# Patient Record
Sex: Female | Born: 1995 | Race: Black or African American | Hispanic: No | Marital: Single | Smoking: Never smoker
Health system: Southern US, Community
[De-identification: ages and names within clinical notes are randomized; demographics above are authoritative.]

## PROBLEM LIST (undated history)

## (undated) DIAGNOSIS — Z789 Other specified health status: Secondary | ICD-10-CM

## (undated) DIAGNOSIS — I209 Angina pectoris, unspecified: Secondary | ICD-10-CM

## (undated) HISTORY — PX: NO PAST SURGERIES: SHX2092

---
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
## 2017-07-16 ENCOUNTER — Other Ambulatory Visit: Payer: Self-pay

## 2017-07-16 ENCOUNTER — Encounter (HOSPITAL_COMMUNITY): Payer: Self-pay | Admitting: Emergency Medicine

## 2017-07-16 DIAGNOSIS — R0789 Other chest pain: Secondary | ICD-10-CM | POA: Insufficient documentation

## 2017-07-16 NOTE — ED Triage Notes (Signed)
Chest pain reported for several months  Pt has seen noone taken no meds

## 2017-07-17 ENCOUNTER — Emergency Department (HOSPITAL_COMMUNITY): Payer: Self-pay

## 2017-07-17 ENCOUNTER — Emergency Department (HOSPITAL_COMMUNITY)
Admission: EM | Admit: 2017-07-17 | Discharge: 2017-07-17 | Disposition: A | Discharge status: Home / Self Care | Payer: Self-pay | Attending: Emergency Medicine | Admitting: Emergency Medicine

## 2017-07-17 DIAGNOSIS — R0789 Other chest pain: Secondary | ICD-10-CM

## 2017-07-17 LAB — BASIC METABOLIC PANEL
Anion gap: 7 (ref 5–15)
BUN: 14 mg/dL (ref 6–20)
CO2: 27 mmol/L (ref 22–32)
Calcium: 9.1 mg/dL (ref 8.9–10.3)
Chloride: 107 mmol/L (ref 101–111)
Creatinine, Ser: 0.6 mg/dL (ref 0.44–1.00)
GFR calc Af Amer: >60 mL/min (ref >60)
GFR calc non Af Amer: >60 mL/min (ref >60)
Glucose, Bld: 100 mg/dL — ABNORMAL HIGH (ref 65–99)
Potassium: 3.5 mmol/L (ref 3.5–5.1)
Sodium: 141 mmol/L (ref 135–145)

## 2017-07-17 LAB — CBC WITH DIFFERENTIAL/PLATELET
Basophils Absolute: 0 10*3/uL (ref 0.0–0.1)
Basophils Relative: 0 %
Eosinophils Absolute: 0 10*3/uL (ref 0.0–0.7)
Eosinophils Relative: 1 %
HCT: 39.3 % (ref 36.0–46.0)
Hemoglobin: 12.6 g/dL (ref 12.0–15.0)
Lymphocytes Relative: 36 %
Lymphs Abs: 2.4 10*3/uL (ref 0.7–4.0)
MCH: 28.7 pg (ref 26.0–34.0)
MCHC: 32.1 g/dL (ref 30.0–36.0)
MCV: 89.5 fL (ref 78.0–100.0)
Monocytes Absolute: 0.5 10*3/uL (ref 0.1–1.0)
Monocytes Relative: 7 %
Neutro Abs: 3.8 10*3/uL (ref 1.7–7.7)
Neutrophils Relative %: 56 %
Platelets: 311 10*3/uL (ref 150–400)
RBC: 4.39 MIL/uL (ref 3.87–5.11)
RDW: 13.3 % (ref 11.5–15.5)
WBC: 6.7 10*3/uL (ref 4.0–10.5)

## 2017-07-17 LAB — TROPONIN I: Troponin I: <0.03 ng/mL (ref <0.03)

## 2017-07-17 NOTE — Discharge Instructions (Signed)
Use ice and heat for comfort. Take ibuprofen 600 mg + acetaminophen 650 mg every 6 hrs as needed for pain. Recheck by a primary care doctor if you continue to have symptoms. Return to the ED if you get pain constantly lasting more then 30 minutes or you get a fever, cough or struggle to breathe.

## 2017-07-17 NOTE — ED Provider Notes (Signed)
[facility] EMERGENCY DEPARTMENT Provider Note   CSN: [id number] Arrival date & time: 07/16/17  2247  Time seen 4:35 AM   History   Chief Complaint Chief Complaint  Patient presents with  . Chest Pain    x several months    HPI Rhonda Guzman is a 21 y.o. female.  HPI patient states a few months ago she started getting intermittent chest pain.  She states the pain comes and goes.  She states it occurs in her left upper lateral chest and sometimes her left arm can get numb.  Nothing she does makes the pain worse, nothing she does makes it better, however she has not tried anything.  She states she has had about 3 episodes while waiting to be seen in the ED.  She denies shortness of breath, nausea, vomiting, fever, or cough.  She denies any pain or swelling of her legs.  She states she is not on any hormones including birth control pills.  She states sometimes with the pain she feels like her heart is racing.  She denies being bedridden or recent travel.  Family history she states her paternal grandmother had DVT and PE, her mother has had DVT.  PCP none  History reviewed. No pertinent past medical history.  There are no active problems to display for this patient.   History reviewed. No pertinent surgical history.  OB History    No data available       Home Medications    Prior to Admission medications   Not on File    Family History No family history on file.  Social History Social History   Tobacco Use  . Smoking status: Never Smoker  . Smokeless tobacco: Never Used  Substance Use Topics  . Alcohol use: No    Frequency: Never  . Drug use: No  employed    Allergies   Patient has no known allergies.   Review of Systems Review of Systems  All other systems reviewed and are negative.    Physical Exam Updated Vital Signs BP 114/69 (BP Location: Left Arm)   Pulse 66   Temp 97.8 F (36.6 C) (Oral)   Resp (!) 22   Ht 5\' 6"  (1.676 m)   Wt  103.2 kg (227 lb 8 oz)   LMP 07/06/2017   SpO2 100%   BMI 36.72 kg/m   Vital signs normal    Physical Exam  Constitutional: She is oriented to person, place, and time. She appears well-developed and well-nourished.  Non-toxic appearance. She does not appear ill. No distress.  HENT:  Head: Normocephalic and atraumatic.  Right Ear: External ear normal.  Left Ear: External ear normal.  Nose: Nose normal. No mucosal edema or rhinorrhea.  Mouth/Throat: Oropharynx is clear and moist and mucous membranes are normal. No dental abscesses or uvula swelling.  Eyes: Conjunctivae and EOM are normal. Pupils are equal, round, and reactive to light.  Neck: Normal range of motion and full passive range of motion without pain. Neck supple.  Cardiovascular: Normal rate, regular rhythm and normal heart sounds. Exam reveals no gallop and no friction rub.  No murmur heard. Pulmonary/Chest: Effort normal and breath sounds normal. No respiratory distress. She has no wheezes. She has no rhonchi. She has no rales. She exhibits no tenderness and no crepitus.  Area of pain noted, nontender    Abdominal: Soft. Normal appearance and bowel sounds are normal. She exhibits no distension. There is no tenderness. There is no rebound  and no guarding.  Musculoskeletal: Normal range of motion. She exhibits no edema or tenderness.  Moves all extremities well.   Neurological: She is alert and oriented to person, place, and time. She has normal strength. No cranial nerve deficit.  Skin: Skin is warm, dry and intact. No rash noted. No erythema. No pallor.  Psychiatric: She has a normal mood and affect. Her speech is normal and behavior is normal. Her mood appears not anxious.  Nursing note and vitals reviewed.    ED Treatments / Results  Labs (all labs ordered are listed, but only abnormal results are displayed) Results for orders placed or performed during the hospital encounter of 07/17/17  Basic metabolic panel    Result Value Ref Range   Sodium 141 135 - 145 mmol/L   Potassium 3.5 3.5 - 5.1 mmol/L   Chloride 107 101 - 111 mmol/L   CO2 27 22 - 32 mmol/L   Glucose, Bld 100 (H) 65 - 99 mg/dL   BUN 14 6 - 20 mg/dL   Creatinine, Ser 1.61 0.44 - 1.00 mg/dL   Calcium 9.1 8.9 - 09.6 mg/dL   GFR calc non Af Amer >60 >60 mL/min   GFR calc Af Amer >60 >60 mL/min   Anion gap 7 5 - 15  CBC with Differential  Result Value Ref Range   WBC 6.7 4.0 - 10.5 K/uL   RBC 4.39 3.87 - 5.11 MIL/uL   Hemoglobin 12.6 12.0 - 15.0 g/dL   HCT 04.5 40.9 - 81.1 %   MCV 89.5 78.0 - 100.0 fL   MCH 28.7 26.0 - 34.0 pg   MCHC 32.1 30.0 - 36.0 g/dL   RDW 91.4 78.2 - 95.6 %   Platelets 311 150 - 400 K/uL   Neutrophils Relative % 56 %   Neutro Abs 3.8 1.7 - 7.7 K/uL   Lymphocytes Relative 36 %   Lymphs Abs 2.4 0.7 - 4.0 K/uL   Monocytes Relative 7 %   Monocytes Absolute 0.5 0.1 - 1.0 K/uL   Eosinophils Relative 1 %   Eosinophils Absolute 0.0 0.0 - 0.7 K/uL   Basophils Relative 0 %   Basophils Absolute 0.0 0.0 - 0.1 K/uL  Troponin I  Result Value Ref Range   Troponin I <0.03 <0.03 ng/mL    Laboratory interpretation all normal      EKG  EKG Interpretation  Date/Time:  Sunday July 17 2017 05:03:01 EST Ventricular Rate:  69 PR Interval:    QRS Duration: 102 QT Interval:  418 QTC Calculation: 448 R Axis:   139 Text Interpretation:  Sinus rhythm Right axis deviation Low voltage, precordial leads No old tracing to compare Confirmed by Devoria Albe ([id number]) on 07/17/2017 5:34:36 AM       Radiology Dg Chest 2 View  Result Date: 07/17/2017 CLINICAL DATA:  Intermittent sharp chest pain for several months. LEFT arm numbness. EXAM: CHEST  2 VIEW COMPARISON:  None. FINDINGS: Cardiac silhouette is upper limits of normal in size. Mediastinal silhouette is nonsuspicious. No pleural effusion or focal consolidation. No pneumothorax. Soft tissue planes and included osseous structures are nonsuspicious. IMPRESSION:  Borderline cardiomegaly.  No acute pulmonary process. Electronically Signed   By: Awilda Metro M.D.   On: 07/17/2017 06:09    Procedures Procedures (including critical care time)  Medications Ordered in ED Medications - No data to display   Initial Impression / Assessment and Plan / ED Course  I have reviewed the triage vital signs and the nursing  notes.  Pertinent labs & imaging results that were available during my care of the patient were reviewed by me and considered in my medical decision making (see chart for details).     PERC test is 0, Wells criteria is -2, ddimer was not done.   Chest x-ray, EKG, and basic blood work was ordered.  Check at 7 AM we discussed her test results.  She was advised to use ice and heat for comfort, take ibuprofen and Tylenol for pain.  She should be rechecked by primary care doctor if she continues to have discomfort.  She should return to the ED however she has constant pain that last more than 30 minutes where she has trouble breathing.  Final Clinical Impressions(s) / ED Diagnoses   Final diagnoses:  Atypical chest pain    ED Discharge Orders    None    OTC ibuprofen and acetaminophen  Plan discharge  Devoria AlbeIva Scottie Stanish, MD, Concha PyoFACEP    Pegge Cumberledge, MD 07/17/17 [phone]

## 2018-09-26 ENCOUNTER — Emergency Department (HOSPITAL_COMMUNITY)
Admission: EM | Admit: 2018-09-26 | Discharge: 2018-09-27 | Disposition: A | Discharge status: Home / Self Care | Payer: No Typology Code available for payment source | Attending: Emergency Medicine | Admitting: Emergency Medicine

## 2018-09-26 ENCOUNTER — Emergency Department (HOSPITAL_COMMUNITY): Payer: No Typology Code available for payment source

## 2018-09-26 ENCOUNTER — Other Ambulatory Visit: Payer: Self-pay

## 2018-09-26 ENCOUNTER — Encounter (HOSPITAL_COMMUNITY): Payer: Self-pay | Admitting: Emergency Medicine

## 2018-09-26 DIAGNOSIS — S161XXA Strain of muscle, fascia and tendon at neck level, initial encounter: Secondary | ICD-10-CM | POA: Diagnosis not present

## 2018-09-26 DIAGNOSIS — Y92410 Unspecified street and highway as the place of occurrence of the external cause: Secondary | ICD-10-CM | POA: Insufficient documentation

## 2018-09-26 DIAGNOSIS — M25512 Pain in left shoulder: Secondary | ICD-10-CM | POA: Diagnosis not present

## 2018-09-26 DIAGNOSIS — S199XXA Unspecified injury of neck, initial encounter: Secondary | ICD-10-CM | POA: Diagnosis present

## 2018-09-26 DIAGNOSIS — Y9389 Activity, other specified: Secondary | ICD-10-CM | POA: Insufficient documentation

## 2018-09-26 DIAGNOSIS — Y999 Unspecified external cause status: Secondary | ICD-10-CM | POA: Diagnosis not present
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
## 2018-09-27 MED ORDER — IBUPROFEN 800 MG PO TABS
800.0000 mg | ORAL_TABLET | Freq: Once | ORAL | Status: AC
  Administered 2018-09-27: 800 mg via ORAL
  Filled 2018-09-27: qty 1

## 2018-09-27 MED ORDER — CYCLOBENZAPRINE HCL 10 MG PO TABS
10.0000 mg | ORAL_TABLET | Freq: Once | ORAL | Status: AC
  Administered 2018-09-27: 10 mg via ORAL
  Filled 2018-09-27: qty 1

## 2018-09-27 MED ORDER — CYCLOBENZAPRINE HCL 10 MG PO TABS: 10.0000 mg | ORAL_TABLET | Freq: Three times a day (TID) | ORAL | 0 refills | Status: DC | PRN

## 2018-09-27 MED ORDER — IBUPROFEN 800 MG PO TABS: 800.0000 mg | ORAL_TABLET | Freq: Three times a day (TID) | ORAL | 0 refills | Status: DC

## 2018-09-27 NOTE — Discharge Instructions (Addendum)
Apply ice packs on and off to your neck.  Follow-up with your primary doctor or return to the ER for any worsening symptoms.

## 2019-03-29 ENCOUNTER — Other Ambulatory Visit: Payer: Self-pay

## 2019-03-29 ENCOUNTER — Emergency Department (HOSPITAL_BASED_OUTPATIENT_CLINIC_OR_DEPARTMENT_OTHER)
Admission: EM | Admit: 2019-03-29 | Discharge: 2019-03-29 | Disposition: A | Discharge status: Home / Self Care | Payer: No Typology Code available for payment source | Attending: Emergency Medicine | Admitting: Emergency Medicine

## 2019-03-29 ENCOUNTER — Emergency Department (HOSPITAL_BASED_OUTPATIENT_CLINIC_OR_DEPARTMENT_OTHER): Payer: No Typology Code available for payment source

## 2019-03-29 ENCOUNTER — Encounter (HOSPITAL_BASED_OUTPATIENT_CLINIC_OR_DEPARTMENT_OTHER): Payer: Self-pay | Admitting: *Deleted

## 2019-03-29 DIAGNOSIS — Y998 Other external cause status: Secondary | ICD-10-CM | POA: Insufficient documentation

## 2019-03-29 DIAGNOSIS — M546 Pain in thoracic spine: Secondary | ICD-10-CM

## 2019-03-29 DIAGNOSIS — M542 Cervicalgia: Secondary | ICD-10-CM | POA: Diagnosis present

## 2019-03-29 DIAGNOSIS — Y9389 Activity, other specified: Secondary | ICD-10-CM | POA: Insufficient documentation

## 2019-03-29 DIAGNOSIS — Y92410 Unspecified street and highway as the place of occurrence of the external cause: Secondary | ICD-10-CM | POA: Diagnosis not present

## 2019-03-29 MED ORDER — METHOCARBAMOL 500 MG PO TABS: 500.0000 mg | ORAL_TABLET | Freq: Three times a day (TID) | ORAL | 0 refills | Status: DC | PRN

## 2019-03-29 MED ORDER — NAPROXEN 250 MG PO TABS
500.0000 mg | ORAL_TABLET | Freq: Once | ORAL | Status: AC
  Administered 2019-03-29: 500 mg via ORAL
  Filled 2019-03-29: qty 2

## 2019-03-29 MED ORDER — IBUPROFEN 800 MG PO TABS: 800.0000 mg | ORAL_TABLET | Freq: Three times a day (TID) | ORAL | 0 refills | Status: DC | PRN

## 2019-03-29 NOTE — ED Provider Notes (Signed)
Blood pressure 119/82, pulse 81, temperature 98.6 F (37 C), temperature source Oral, resp. rate 20, height 5\' 8"  (1.727 m), weight 117.9 kg, last menstrual period 03/14/2019, SpO2 98 %.  Assuming care from Dr. Sedonia Small.  In short, Rhonda Guzman is a 23 y.o. female with a chief complaint of Marine scientist .  Refer to the original H&P for additional details.  The current plan of care is to f/u on imaging and reassess.  Plain films and imaging reviewed with no acute findings.  C-collar removed and spine cleared.  Patient provided prescription for Robaxin along with Motrin.  Discussed the drowsy side effects of muscle relaxer and encouraged patient not to drive while taking this.  Discussed increased soreness and stiffness over the next several days.  Discussed PCP follow-up plan if symptoms worsen.    Margette Fast, MD 03/29/19 [phone]
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
## 2019-03-29 NOTE — Discharge Instructions (Signed)
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
## 2021-01-15 ENCOUNTER — Other Ambulatory Visit (HOSPITAL_COMMUNITY): Payer: Self-pay

## 2023-08-02 ENCOUNTER — Other Ambulatory Visit: Payer: Self-pay | Admitting: Physician Assistant

## 2023-08-02 ENCOUNTER — Ambulatory Visit (INDEPENDENT_AMBULATORY_CARE_PROVIDER_SITE_OTHER): Admitting: Orthopaedic Surgery

## 2023-08-02 ENCOUNTER — Encounter (HOSPITAL_COMMUNITY): Payer: Self-pay | Admitting: Orthopaedic Surgery

## 2023-08-02 ENCOUNTER — Other Ambulatory Visit (INDEPENDENT_AMBULATORY_CARE_PROVIDER_SITE_OTHER)

## 2023-08-02 ENCOUNTER — Encounter: Payer: Self-pay | Admitting: Orthopaedic Surgery

## 2023-08-02 VITALS — BP 119/83 | HR 86 | Ht 67.0 in | Wt 280.0 lb

## 2023-08-02 DIAGNOSIS — S82832A Other fracture of upper and lower end of left fibula, initial encounter for closed fracture: Secondary | ICD-10-CM | POA: Diagnosis not present

## 2023-08-02 DIAGNOSIS — M25572 Pain in left ankle and joints of left foot: Secondary | ICD-10-CM | POA: Diagnosis not present

## 2023-08-02 MED ORDER — HYDROCODONE-ACETAMINOPHEN 5-325 MG PO TABS: 1.0000 | ORAL_TABLET | Freq: Four times a day (QID) | ORAL | 0 refills | Status: DC | PRN

## 2023-08-02 NOTE — Progress Notes (Signed)
SDW call  Patient was given pre-op instructions over the phone. Patient verbalized understanding of instructions provided.     PCP - denies Cardiologist -  Pulmonary:    PPM/ICD - denies Device Orders - na Rep Notified - na   Chest x-ray - na EKG -  na Stress Test - ECHO -  Cardiac Cath -   Sleep Study/sleep apnea/CPAP: denies  Non-diabetic   Blood Thinner Instructions: denies Aspirin Instructions:denies   ERAS Protcol - Clears until 1210   COVID TEST- na    Anesthesia review: No   Patient denies shortness of breath, fever, cough and chest pain over the phone call  Your procedure is scheduled on Wednesday August 03, 2023  Report to [facility] Main Entrance "A" at 1240 PM, then check in with the Admitting office.  Call this number if you have problems the morning of surgery:  [phone]   If you have any questions prior to your surgery date call [phone]: Open Monday-Friday 8am-4pm If you experience any cold or flu symptoms such as cough, fever, chills, shortness of breath, etc. between now and your scheduled surgery, please notify us at the above number    Remember:  Do not eat after midnight the night before your surgery  You may drink clear liquids until  1210  the morning of your surgery.   Clear liquids allowed are: Water, Non-Citrus Juices (without pulp), Carbonated Beverages, Clear Tea, Black Coffee ONLY (NO MILK, CREAM OR POWDERED CREAMER of any kind), and Gatorade   Take these medicines as needed the morning of surgery with A SIP OF WATER:  Norco  As of today, STOP taking any Aspirin (unless otherwise instructed by your surgeon) Aleve, Naproxen, Ibuprofen, Motrin, Advil, Goody's, BC's, all herbal medications, fish oil, and all vitamins.

## 2023-08-02 NOTE — Anesthesia Preprocedure Evaluation (Signed)
Anesthesia Evaluation  Patient identified by MRN, date of birth, ID band Patient awake    Reviewed: Allergy & Precautions, NPO status , Patient's Chart, lab work & pertinent test results  Airway Mallampati: III  TM Distance: >3 FB Neck ROM: Full    Dental  (+) Teeth Intact, Dental Advisory Given   Pulmonary neg pulmonary ROS   Pulmonary exam normal breath sounds clear to auscultation       Cardiovascular negative cardio ROS Normal cardiovascular exam Rhythm:Regular Rate:Normal     Neuro/Psych negative neurological ROS  negative psych ROS   GI/Hepatic negative GI ROS, Neg liver ROS,,,  Endo/Other    Class 3 obesityBMI 44  Renal/GU negative Renal ROS  negative genitourinary   Musculoskeletal   Abdominal  (+) + obese  Peds  Hematology negative hematology ROS (+)   Anesthesia Other Findings left lateral malleolar ankle fracture  Reproductive/Obstetrics negative OB ROS                              Anesthesia Physical Anesthesia Plan  ASA: 3  Anesthesia Plan: General and Regional   Post-op Pain Management: Tylenol PO (pre-op)*, Toradol IV (intra-op)* and Regional block*   Induction: Intravenous  PONV Risk Score and Plan: 3 and Ondansetron, Dexamethasone, Midazolam, Treatment may vary due to age or medical condition and Scopolamine patch - Pre-op  Airway Management Planned: LMA  Additional Equipment: None  Intra-op Plan:   Post-operative Plan: Extubation in OR  Informed Consent: I have reviewed the patients History and Physical, chart, labs and discussed the procedure including the risks, benefits and alternatives for the proposed anesthesia with the patient or authorized representative who has indicated his/her understanding and acceptance.     Dental advisory given  Plan Discussed with: CRNA  Anesthesia Plan Comments:        Anesthesia Quick Evaluation

## 2023-08-02 NOTE — Progress Notes (Addendum)
Office Visit Note   Patient: Rhonda Guzman           Date of Birth: [birth year]           MRN: [id number] Visit Date: 08/02/2023              Requested by: No referring provider defined for this encounter. PCP: Patient, No Pcp Per   Assessment & Plan: Visit Diagnoses:  1. Pain in left ankle and joints of left foot     Plan: Discussed with patient is already been 13 days since her fracture and her care needs to be expedited before it heals in a displaced position.  This is a intra-articular fracture and her ankle is shifted several millimeters.  Plan would be ORIF lateral malleolar fixation with fibular plate.  This could be done with General LMA anesthesia with peripheral nerve block.  Discussed with patient this would be up to the anesthesia team.  If she had infective block this could be outpatient surgery.  Prescription given for a knee roller.  Questions were elicited and answered risks of surgery including nonunion discussed.  She understands that she will be out of work from regular job for likely 3 months.  If light duty was available where she could be nonweightbearing and was not in contact with autistic kids she could be able to work sooner than that.  Presently she is on pain medication and will need to be off her pain medication before modified duty was even considered.Norco Rx # 30 sent in for post op pain.   Follow-Up Instructions: No follow-ups on file.   Orders:  Orders Placed This Encounter  Procedures   XR Ankle Complete Left   Meds ordered this encounter  Medications   HYDROcodone-acetaminophen (NORCO/VICODIN) 5-325 MG tablet    Sig: Take 1-2 tablets by mouth every 6 (six) hours as needed.    Dispense:  30 tablet    Refill:  0      Procedures: No procedures performed   Clinical Data: No additional findings.   Subjective: Chief Complaint  Patient presents with   Left Ankle - Pain    OTJI 07/20/2023    HPI 27 year old female here with her live-in  partner who was injured on the job.  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  She had problems with the splint and then had to go back to the emergency department splint was removed she is placed in a cam boot.  This is her first visit with orthopedic surgery she had some hydrocodone but only has 3 tablets left and she obviously has been nonweightbearing out of work and has crutches but is not good with crutches.  Patient is use Norco 5/325 for pain and also ibuprofen.  Review of Systems no cardiac history.  Patient has been active healthy.  She does have morbid obesity.   Objective: Vital Signs: BP 119/83   Pulse 86   Ht 5\' 7"  (1.702 m)   Wt 280 lb (127 kg)   BMI 43.85 kg/m   Physical Exam Constitutional:      Appearance: She is well-developed.  HENT:     Head: Normocephalic.     Right Ear: External ear normal.     Left Ear: External ear normal. There is no  impacted cerumen.  Eyes:     Pupils: Pupils are equal, round, and reactive to light.  Neck:     Thyroid: No thyromegaly.     Trachea: No tracheal deviation.  Cardiovascular:     Rate and Rhythm: Normal rate.  Pulmonary:     Effort: Pulmonary effort is normal.  Abdominal:     Palpations: Abdomen is soft.  Musculoskeletal:     Cervical back: No rigidity.  Skin:    General: Skin is warm and dry.  Neurological:     Mental Status: She is alert and oriented to person, place, and time.  Psychiatric:        Behavior: Behavior normal.     Ortho Exam left ankle swelling tenderness lateral malleolus skin is intact.  Specialty Comments:  No specialty comments available.  Imaging: XR Ankle Complete Left  Result Date: 08/02/2023 Three-view x-rays left ankle obtained and reviewed.  There is a lateral malleolar fracture displaced with lateral shifting of  the talus and widening of the medial clear space. Impression: Displaced lateral malleolar fracture with ankle mortise lateral displacement and widening the medial clear space.    PMFS History: There are no problems to display for this patient.  No past medical history on file.  No family history on file.  No past surgical history on file. Social History   Occupational History   Not on file  Tobacco Use   Smoking status: Never   Smokeless tobacco: Never  Substance and Sexual Activity   Alcohol use: No   Drug use: No   Sexual activity: Not on file

## 2023-08-03 ENCOUNTER — Other Ambulatory Visit: Payer: Self-pay

## 2023-08-03 ENCOUNTER — Ambulatory Visit (HOSPITAL_COMMUNITY): Admitting: Anesthesiology

## 2023-08-03 ENCOUNTER — Ambulatory Visit (HOSPITAL_COMMUNITY): Payer: Self-pay

## 2023-08-03 ENCOUNTER — Encounter (HOSPITAL_COMMUNITY)
Admission: RE | Disposition: A | Discharge status: Home / Self Care | Payer: Self-pay | Source: Home / Self Care | Attending: Orthopaedic Surgery

## 2023-08-03 ENCOUNTER — Encounter (HOSPITAL_COMMUNITY): Payer: Self-pay | Admitting: Orthopaedic Surgery

## 2023-08-03 ENCOUNTER — Ambulatory Visit (HOSPITAL_COMMUNITY)
Admission: RE | Admit: 2023-08-03 | Discharge: 2023-08-03 | Disposition: A | Discharge status: Home / Self Care | Attending: Orthopaedic Surgery | Admitting: Orthopaedic Surgery

## 2023-08-03 ENCOUNTER — Other Ambulatory Visit: Payer: Self-pay | Admitting: Orthopedic Surgery

## 2023-08-03 DIAGNOSIS — Z6841 Body Mass Index (BMI) 40.0 and over, adult: Secondary | ICD-10-CM | POA: Diagnosis not present

## 2023-08-03 DIAGNOSIS — Z01818 Encounter for other preprocedural examination: Secondary | ICD-10-CM

## 2023-08-03 DIAGNOSIS — S8262XA Displaced fracture of lateral malleolus of left fibula, initial encounter for closed fracture: Secondary | ICD-10-CM

## 2023-08-03 DIAGNOSIS — W010XXA Fall on same level from slipping, tripping and stumbling without subsequent striking against object, initial encounter: Secondary | ICD-10-CM | POA: Diagnosis not present

## 2023-08-03 DIAGNOSIS — S82832A Other fracture of upper and lower end of left fibula, initial encounter for closed fracture: Secondary | ICD-10-CM

## 2023-08-03 HISTORY — DX: Other specified health status: Z78.9

## 2023-08-03 HISTORY — DX: Angina pectoris, unspecified: I20.9

## 2023-08-03 HISTORY — PX: ORIF ANKLE FRACTURE: SHX5408

## 2023-08-03 LAB — SURGICAL PCR SCREEN
MRSA, PCR: NEGATIVE
Staphylococcus aureus: POSITIVE — AB

## 2023-08-03 LAB — CBC
HCT: 43.9 % (ref 36.0–46.0)
Hemoglobin: 14.2 g/dL (ref 12.0–15.0)
MCH: 28 pg (ref 26.0–34.0)
MCHC: 32.3 g/dL (ref 30.0–36.0)
MCV: 86.6 fL (ref 80.0–100.0)
Platelets: 373 10*3/uL (ref 150–400)
RBC: 5.07 MIL/uL (ref 3.87–5.11)
RDW: 13.2 % (ref 11.5–15.5)
WBC: 5.8 10*3/uL (ref 4.0–10.5)
nRBC: 0 % (ref 0.0–0.2)

## 2023-08-03 LAB — POCT PREGNANCY, URINE: Preg Test, Ur: NEGATIVE

## 2023-08-03 SURGERY — OPEN REDUCTION INTERNAL FIXATION (ORIF) ANKLE FRACTURE
Anesthesia: Regional | Site: Ankle | Laterality: Left

## 2023-08-03 MED ORDER — CHLORHEXIDINE GLUCONATE 0.12 % MT SOLN
OROMUCOSAL | Status: AC
  Administered 2023-08-03: 15 mL via OROMUCOSAL
  Filled 2023-08-03: qty 15

## 2023-08-03 MED ORDER — MIDAZOLAM HCL 2 MG/2ML IJ SOLN
INTRAMUSCULAR | Status: DC | PRN
  Administered 2023-08-03: 2 mg via INTRAVENOUS

## 2023-08-03 MED ORDER — KETOROLAC TROMETHAMINE 30 MG/ML IJ SOLN: 30.0000 mg | Freq: Once | INTRAMUSCULAR | Status: DC | PRN

## 2023-08-03 MED ORDER — SCOPOLAMINE 1 MG/3DAYS TD PT72
1.0000 | MEDICATED_PATCH | TRANSDERMAL | Status: DC
  Administered 2023-08-03: 1.5 mg via TRANSDERMAL
  Filled 2023-08-03: qty 1

## 2023-08-03 MED ORDER — BUPIVACAINE HCL (PF) 0.25 % IJ SOLN
INTRAMUSCULAR | Status: AC
  Filled 2023-08-03: qty 30

## 2023-08-03 MED ORDER — OXYCODONE HCL 5 MG PO TABS
ORAL_TABLET | ORAL | Status: AC
  Filled 2023-08-03: qty 1

## 2023-08-03 MED ORDER — AMISULPRIDE (ANTIEMETIC) 5 MG/2ML IV SOLN: 10.0000 mg | Freq: Once | INTRAVENOUS | Status: DC | PRN

## 2023-08-03 MED ORDER — CHLORHEXIDINE GLUCONATE 0.12 % MT SOLN: 15.0000 mL | Freq: Once | OROMUCOSAL | Status: AC

## 2023-08-03 MED ORDER — OXYCODONE HCL 5 MG/5ML PO SOLN: 5.0000 mg | Freq: Once | ORAL | Status: AC | PRN

## 2023-08-03 MED ORDER — CEFAZOLIN IN SODIUM CHLORIDE 3-0.9 GM/100ML-% IV SOLN
INTRAVENOUS | Status: AC
  Filled 2023-08-03: qty 100

## 2023-08-03 MED ORDER — 0.9 % SODIUM CHLORIDE (POUR BTL) OPTIME
TOPICAL | Status: DC | PRN
  Administered 2023-08-03: 1000 mL

## 2023-08-03 MED ORDER — HYDROMORPHONE HCL 1 MG/ML IJ SOLN
INTRAMUSCULAR | Status: AC
  Administered 2023-08-03: 0.5 mg via INTRAVENOUS
  Filled 2023-08-03: qty 1

## 2023-08-03 MED ORDER — MIDAZOLAM HCL 2 MG/2ML IJ SOLN
INTRAMUSCULAR | Status: AC
  Administered 2023-08-03: 2 mg
  Filled 2023-08-03: qty 2

## 2023-08-03 MED ORDER — ORAL CARE MOUTH RINSE: 15.0000 mL | Freq: Once | OROMUCOSAL | Status: AC

## 2023-08-03 MED ORDER — MIDAZOLAM HCL 2 MG/2ML IJ SOLN
INTRAMUSCULAR | Status: AC
  Filled 2023-08-03: qty 2

## 2023-08-03 MED ORDER — OXYCODONE HCL 5 MG PO TABS
5.0000 mg | ORAL_TABLET | Freq: Once | ORAL | Status: AC | PRN
  Administered 2023-08-03: 5 mg via ORAL

## 2023-08-03 MED ORDER — MEPERIDINE HCL 25 MG/ML IJ SOLN: 6.2500 mg | INTRAMUSCULAR | Status: DC | PRN

## 2023-08-03 MED ORDER — FENTANYL CITRATE (PF) 250 MCG/5ML IJ SOLN
INTRAMUSCULAR | Status: DC | PRN
  Administered 2023-08-03: 50 ug via INTRAVENOUS
  Administered 2023-08-03 (×2): 100 ug via INTRAVENOUS

## 2023-08-03 MED ORDER — LACTATED RINGERS IV SOLN
INTRAVENOUS | Status: DC
Start: 2023-08-03 — End: 2023-08-04

## 2023-08-03 MED ORDER — ACETAMINOPHEN 500 MG PO TABS: 1000.0000 mg | ORAL_TABLET | Freq: Once | ORAL | Status: AC

## 2023-08-03 MED ORDER — HYDROMORPHONE HCL 1 MG/ML IJ SOLN
0.2500 mg | INTRAMUSCULAR | Status: DC | PRN
  Administered 2023-08-03: 0.5 mg via INTRAVENOUS

## 2023-08-03 MED ORDER — CEFAZOLIN IN SODIUM CHLORIDE 3-0.9 GM/100ML-% IV SOLN
3.0000 g | INTRAVENOUS | Status: AC
  Administered 2023-08-03: 3 g via INTRAVENOUS

## 2023-08-03 MED ORDER — KETOROLAC TROMETHAMINE 30 MG/ML IJ SOLN
INTRAMUSCULAR | Status: AC
  Filled 2023-08-03: qty 1

## 2023-08-03 MED ORDER — ONDANSETRON HCL 4 MG/2ML IJ SOLN: 4.0000 mg | Freq: Once | INTRAMUSCULAR | Status: DC | PRN

## 2023-08-03 MED ORDER — FENTANYL CITRATE (PF) 250 MCG/5ML IJ SOLN
INTRAMUSCULAR | Status: AC
  Filled 2023-08-03: qty 5

## 2023-08-03 MED ORDER — PROPOFOL 10 MG/ML IV BOLUS
INTRAVENOUS | Status: DC | PRN
  Administered 2023-08-03: 200 mg via INTRAVENOUS

## 2023-08-03 MED ORDER — LIDOCAINE 2% (20 MG/ML) 5 ML SYRINGE
INTRAMUSCULAR | Status: DC | PRN
  Administered 2023-08-03: 100 mg via INTRAVENOUS

## 2023-08-03 MED ORDER — DEXAMETHASONE SODIUM PHOSPHATE 10 MG/ML IJ SOLN
INTRAMUSCULAR | Status: DC | PRN
  Administered 2023-08-03: 10 mg via INTRAVENOUS

## 2023-08-03 MED ORDER — BUPIVACAINE HCL (PF) 0.25 % IJ SOLN
INTRAMUSCULAR | Status: DC | PRN
  Administered 2023-08-03: 20 mL via EPIDURAL
  Administered 2023-08-03: 25 mL via EPIDURAL

## 2023-08-03 MED ORDER — FENTANYL CITRATE (PF) 100 MCG/2ML IJ SOLN
INTRAMUSCULAR | Status: AC
  Administered 2023-08-03: 100 ug
  Filled 2023-08-03: qty 2

## 2023-08-03 MED ORDER — ACETAMINOPHEN 500 MG PO TABS
ORAL_TABLET | ORAL | Status: AC
  Administered 2023-08-03: 1000 mg via ORAL
  Filled 2023-08-03: qty 2

## 2023-08-03 MED ORDER — HYDROCODONE-ACETAMINOPHEN 5-325 MG PO TABS: 1.0000 | ORAL_TABLET | Freq: Four times a day (QID) | ORAL | 0 refills | Status: DC | PRN

## 2023-08-03 MED ORDER — ONDANSETRON HCL 4 MG/2ML IJ SOLN
INTRAMUSCULAR | Status: DC | PRN
  Administered 2023-08-03: 4 mg via INTRAVENOUS

## 2023-08-03 SURGICAL SUPPLY — 52 items
BAG COUNTER SPONGE SURGICOUNT (BAG) ×1 IMPLANT
BANDAGE ESMARK 6X9 LF (GAUZE/BANDAGES/DRESSINGS) IMPLANT
BIT DRILL QC 2.5X135 (BIT) IMPLANT
BNDG ELASTIC 6INX 5YD STR LF (GAUZE/BANDAGES/DRESSINGS) IMPLANT
BNDG ELASTIC 6X10 VLCR STRL LF (GAUZE/BANDAGES/DRESSINGS) IMPLANT
BNDG ESMARK 6X9 LF (GAUZE/BANDAGES/DRESSINGS) ×1
COVER MAYO STAND STRL (DRAPES) ×1 IMPLANT
COVER SURGICAL LIGHT HANDLE (MISCELLANEOUS) ×1 IMPLANT
CUFF TOURN SGL QUICK 18 NS (TOURNIQUET CUFF) IMPLANT
DRAPE C-ARM 42X72 X-RAY (DRAPES) IMPLANT
DRAPE HALF SHEET 40X57 (DRAPES) ×1 IMPLANT
DRAPE INCISE IOBAN 66X45 STRL (DRAPES) ×1 IMPLANT
DRAPE U-SHAPE 47X51 STRL (DRAPES) ×1 IMPLANT
DURAPREP 26ML APPLICATOR (WOUND CARE) ×1 IMPLANT
ELECT REM PT RETURN 9FT ADLT (ELECTROSURGICAL) ×1
ELECTRODE REM PT RTRN 9FT ADLT (ELECTROSURGICAL) ×1 IMPLANT
GAUZE PAD ABD 8X10 STRL (GAUZE/BANDAGES/DRESSINGS) ×1 IMPLANT
GAUZE SPONGE 4X4 12PLY STRL (GAUZE/BANDAGES/DRESSINGS) ×1 IMPLANT
GAUZE XEROFORM 5X9 LF (GAUZE/BANDAGES/DRESSINGS) ×1 IMPLANT
GLOVE BIOGEL PI IND STRL 8 (GLOVE) ×2 IMPLANT
GLOVE ORTHO TXT STRL SZ7.5 (GLOVE) ×2 IMPLANT
GOWN STRL REUS W/ TWL LRG LVL3 (GOWN DISPOSABLE) ×1 IMPLANT
GOWN STRL REUS W/ TWL XL LVL3 (GOWN DISPOSABLE) ×1 IMPLANT
GOWN STRL REUS W/TWL 2XL LVL3 (GOWN DISPOSABLE) ×1 IMPLANT
KIT BASIN OR (CUSTOM PROCEDURE TRAY) ×1 IMPLANT
KIT TURNOVER KIT B (KITS) ×1 IMPLANT
MANIFOLD NEPTUNE II (INSTRUMENTS) ×1 IMPLANT
NS IRRIG 1000ML POUR BTL (IV SOLUTION) ×1 IMPLANT
PACK ORTHO EXTREMITY (CUSTOM PROCEDURE TRAY) ×1 IMPLANT
PAD ARMBOARD 7.5X6 YLW CONV (MISCELLANEOUS) ×2 IMPLANT
PAD CAST 4YDX4 CTTN HI CHSV (CAST SUPPLIES) ×1 IMPLANT
PADDING CAST COTTON 6X4 STRL (CAST SUPPLIES) ×1 IMPLANT
PADDING CAST SYNTHETIC 4X4 STR (CAST SUPPLIES) IMPLANT
PROS LCP PLATE 6H 85MM (Plate) ×1 IMPLANT
PROSTHESIS LCP PLATE 6H 85MM (Plate) IMPLANT
SCREW CANCEL 4.0 12MM (Screw) IMPLANT
SCREW CANCEL 4.0 14MM (Screw) IMPLANT
SCREW LOCK CORT ST 3.5X10 (Screw) IMPLANT
SCREW LOCK CORT ST 3.5X12 (Screw) IMPLANT
SCREW LOCK CORT ST 3.5X14 (Screw) IMPLANT
SCREW LOCK T15 FT 12X3.5X2.9X (Screw) IMPLANT
SPONGE T-LAP 18X18 ~~LOC~~+RFID (SPONGE) ×1 IMPLANT
STAPLER SKIN PROX WIDE 3.9 (STAPLE) IMPLANT
SUCTION TUBE FRAZIER 10FR DISP (SUCTIONS) ×1 IMPLANT
SUT ETHILON 3 0 PS 1 (SUTURE) ×2 IMPLANT
SUT VIC AB 2-0 CT1 TAPERPNT 27 (SUTURE) ×2 IMPLANT
SYR CONTROL 10ML LL (SYRINGE) ×1 IMPLANT
TOWEL GREEN STERILE (TOWEL DISPOSABLE) ×1 IMPLANT
TOWEL GREEN STERILE FF (TOWEL DISPOSABLE) ×1 IMPLANT
TUBE CONNECTING 12X1/4 (SUCTIONS) ×1 IMPLANT
WATER STERILE IRR 1000ML POUR (IV SOLUTION) ×1 IMPLANT
YANKAUER SUCT BULB TIP NO VENT (SUCTIONS) ×1 IMPLANT

## 2023-08-03 NOTE — Interval H&P Note (Signed)
History and Physical Interval Note:  08/03/2023 3:06 PM  Rhonda Guzman  has presented today for surgery, with the diagnosis of left lateral malleolar ankle fracture.  The various methods of treatment have been discussed with the patient and family. After consideration of risks, benefits and other options for treatment, the patient has consented to  Procedure(s): OPEN REDUCTION INTERNAL FIXATION (ORIF) LEFT ANKLE LATERAL MALLEOLUS FRACTURE (Left) as a surgical intervention.  The patient's history has been reviewed, patient examined, no change in status, stable for surgery.  I have reviewed the patient's chart and labs.  Questions were answered to the patient's satisfaction.     Eldred Manges

## 2023-08-03 NOTE — Anesthesia Procedure Notes (Signed)
Anesthesia Regional Block: Adductor canal block   Pre-Anesthetic Checklist: , timeout performed,  Correct Patient, Correct Site, Correct Laterality,  Correct Procedure, Correct Position, site marked,  Risks and benefits discussed,  Surgical consent,  Pre-op evaluation,  At surgeon's request and post-op pain management  Laterality: Left  Prep: chloraprep       Needles:  Injection technique: Single-shot  Needle Type: Echogenic Stimulator Needle     Needle Length: 9cm  Needle Gauge: 21     Additional Needles:   Procedures:,,,, ultrasound used (permanent image in chart),,    Narrative:  Start time: 08/03/2023 2:55 PM End time: 08/03/2023 2:58 PM Injection made incrementally with aspirations every 5 mL.  Performed by: Personally  Anesthesiologist: [place] Nation, MD  Additional Notes: Discussed risks and benefits of the nerve block in detail, including but not limited vascular injury, permanent nerve damage and infection.   Patient tolerated the procedure well. Local anesthetic introduced in an incremental fashion under minimal resistance after negative aspirations. No paresthesias were elicited. After completion of the procedure, no acute issues were identified and patient continued to be monitored by RN.

## 2023-08-03 NOTE — H&P (Signed)
Expand All Collapse All    Office Visit Note              Patient: Rhonda Guzman                                        Date of Birth: 1996-07-31                                                    MRN: [id number] Visit Date: 08/02/2023                                                                     Requested by: No referring provider defined for this encounter. PCP: Patient, No Pcp Per     Assessment & Plan: Visit Diagnoses:  1. Pain in left ankle and joints of left foot       Plan: Discussed with patient is already been 13 days since her fracture and her care needs to be expedited before it heals in a displaced position.  This is a intra-articular fracture and her ankle is shifted several millimeters.  Plan would be ORIF lateral malleolar fixation with fibular plate.  This could be done with General LMA anesthesia with peripheral nerve block.  Discussed with patient this would be up to the anesthesia team.  If she had infective block this could be outpatient surgery.  Prescription given for a knee roller.  Questions were elicited and answered risks of surgery including nonunion discussed.  She understands that she will be out of work from regular job for likely 3 months.  If light duty was available where she could be nonweightbearing and was not in contact with autistic kids she could be able to work sooner than that.  Presently she is on pain medication and will need to be off her pain medication before modified duty was even considered.Norco Rx # 30 sent in for post op pain.    Follow-Up Instructions: No follow-ups on file.    Orders:     Orders Placed This Encounter  Procedures   XR Ankle Complete Left        Meds ordered this encounter  Medications   HYDROcodone-acetaminophen (NORCO/VICODIN) 5-325 MG tablet      Sig: Take 1-2 tablets by mouth every 6 (six) hours as needed.      Dispense:  30 tablet      Refill:  0         Procedures: No procedures performed      Clinical Data: No additional findings.     Subjective:     Chief Complaint  Patient presents with   Left Ankle - Pain      OTJI 07/20/2023      HPI 27 year old female here with her live-in partner who was injured on the job.  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  She had problems with the splint and then had to go back to the emergency department splint was removed she is placed in a cam boot.  This is her first visit with orthopedic surgery she had some hydrocodone but only has 3 tablets left and she obviously has been nonweightbearing out of work and has crutches but is not good with crutches.  Patient is use Norco 5/325 for pain and also ibuprofen.   Review of Systems no cardiac history.  Patient has been active healthy.  She does have morbid obesity.     Objective: Vital Signs: BP 119/83   Pulse 86   Ht 5\' 7"  (1.702 m)   Wt 280 lb (127 kg)   BMI 43.85 kg/m    Physical Exam Constitutional:      Appearance: She is well-developed.  HENT:     Head: Normocephalic.     Right Ear: External ear normal.     Left Ear: External ear normal. There is no impacted cerumen.  Eyes:     Pupils: Pupils are equal, round, and reactive to light.  Neck:     Thyroid: No thyromegaly.     Trachea: No tracheal deviation.  Cardiovascular:     Rate and Rhythm: Normal rate.  Pulmonary:     Effort: Pulmonary effort is normal.  Abdominal:     Palpations: Abdomen is soft.  Musculoskeletal:     Cervical back: No rigidity.  Skin:    General: Skin is warm and dry.  Neurological:     Mental Status: She is alert and oriented to person, place, and time.  Psychiatric:        Behavior: Behavior normal.        Ortho Exam left ankle swelling tenderness lateral malleolus skin is intact.   Specialty Comments:  No specialty  comments available.   Imaging: XR Ankle Complete Left   Result Date: 08/02/2023 Three-view x-rays left ankle obtained and reviewed.  There is a lateral malleolar fracture displaced with lateral shifting of the talus and widening of the medial clear space. Impression: Displaced lateral malleolar fracture with ankle mortise lateral displacement and widening the medial clear space.      PMFS History: There are no problems to display for this patient.   No past medical history on file.      No family history on file.      No past surgical history on file.     Social History        Occupational History   Not on file  Tobacco Use   Smoking status: Never   Smokeless tobacco: Never  Substance and Sexual Activity   Alcohol use: No   Drug use: No   Sexual activity: Not on file

## 2023-08-03 NOTE — Op Note (Signed)
Pre and post operative diagnosis :left lateral malleolar fracture, displaced.  Procedure: ORIF left lateral malleolar fracture with one third tubular plate  Surgeon: Annell Greening, MD  Anesthesia: Adductor block plus popliteal block plus general anesthesia.  Tourniquet 250 Tourniquet x 45 minutes. Implants  PROS LCP PLATE 6H 85MM - [id number]  Inventory Item: PROS LCP PLATE 6H 85MM Serial no.: Model/Cat no.: [id number]  Implant name: PROS LCP PLATE 6H 85MM - [id number] Laterality: Left Area: Ankle  Manufacturer: [employer] Date of Manufacture:   Action: Implanted Number Used: 1   Device Identifier: Device Identifier Type:   SCREW LOCK CORT ST 3.5X10 - [id number]  Inventory Item: SCREW LOCK CORT ST 3.5X10 Serial no.: Model/Cat no.: [id number]  Implant name: SCREW LOCK CORT ST 3.5X10 - [id number] Laterality: Left Area: Ankle  Manufacturer: [employer] Date of Manufacture:   Action: Implanted and Explanted Number Used: 1   Device Identifier: Device Identifier Type:   SCREW LOCK CORT ST 3.5X12 - [id number]  Inventory Item: SCREW LOCK CORT ST 3.5X12 Serial no.: Model/Cat no.: [id number]  Implant name: SCREW LOCK CORT ST 3.5X12 - [id number] Laterality: Left Area: Ankle  Manufacturer: [employer] Date of Manufacture:   Action: Implanted and Explanted Number Used: 1   Device Identifier: Device Identifier Type:   SCREW LOCK CORT ST 3.5X14 - [id number]  Inventory Item: SCREW LOCK CORT ST 3.5X14 Serial no.: Model/Cat no.: [id number]  Implant name: SCREW LOCK CORT ST 3.5X14 - [id number] Laterality: Left Area: Ankle  Manufacturer: [employer] Date of Manufacture:   Action: Implanted Number Used: 1   Device Identifier: Device Identifier Type:   SCREW LOCK T15 FT 12X3.5X2.9X - [id number]  Inventory Item: SCREW LOCK T15 FT 12X3.5X2.9X Serial no.: Model/Cat no.: [id number]  Implant name: SCREW LOCK T15 FT 12X3.5X2.Rogue Jury - [id number] Laterality: Left Area: Ankle  Manufacturer: [employer] Date of Manufacture:   Action: Implanted Number Used: 1   Device Identifier: Device Identifier Type:   SCREW CANCEL 4.0 - [id number]  Inventory Item: SCREW CANCEL 4.0 Serial no.: Model/Cat no.: [id number]  Implant name: SCREW CANCEL 4.0 - [id number] Laterality: Left Area: Ankle  Manufacturer: [employer] Date of Manufacture:   Action: Implanted and Explanted Number Used: 1   Device Identifier: Device Identifier Type:   SCREW CANCEL 4.0 - [id number]  Inventory Item: SCREW CANCEL 4.0 Serial no.: Model/Cat no.: [id number]  Implant name: SCREW CANCEL 4.0 - [id number] Laterality: Left Area: Ankle  Manufacturer: [employer] Date of Manufacture:   Action: Implanted and Explanted Number Used: 1   Device Identifier: Device Identifier Type:    Procedure: After block induction of anesthesia tourniquet application 1015 drape calf ankle and foot was prepped with DuraPrep Ancef was given prophylactically.  Extremity sheets and drapes were applied sterile glove placed over the toes leg was wrapped in Esmarch tourniquet inflated after timeout procedure and tourniquet inflated to 250.  Lateral incision was made subperiosteal dissection.  Patient's fracture was 51-week old and we had to remove some early callus and mobilized the fracture in order to get the distal fragment out to length to help reduce the widened medial clear space.  This took several attempts finally we are able to hold it in satisfactory position and interfrag screw was placed followed by the one third tubular plate.  Distal 2 screws distal most was locking second was cancellous remaining screws were cortical screws.  2010 mm interfrag screw was placed from proximal anterior to posterior distal 90 degrees  to the fracture line.  Final spot pictures were taken showing reduction of the mortise good position of hardware.  Irrigation tourniquet deflation 2-0 Vicryl subcutaneous reapproximation of the  tissue and then skin staple closure Xeroform 4 x 4's Webril ABD and short leg splint was applied with fiberglass.  Patient is transferred to care room and will be discharged home.

## 2023-08-03 NOTE — Transfer of Care (Signed)
Immediate Anesthesia Transfer of Care Note  Patient: Rhonda Guzman  Procedure(s) Performed: OPEN REDUCTION INTERNAL FIXATION (ORIF) LEFT ANKLE LATERAL MALLEOLUS FRACTURE (Left: Ankle)  Patient Location: PACU  Anesthesia Type:General  Level of Consciousness: awake, alert , and oriented  Airway & Oxygen Therapy: Patient Spontanous Breathing  Post-op Assessment: Report given to RN and Post -op Vital signs reviewed and stable  Post vital signs: Reviewed and stable  Last Vitals:  Vitals Value Taken Time  BP 125/77 08/03/23 1815  Temp 36.4 C 08/03/23 1740  Pulse 72 08/03/23 1818  Resp 19 08/03/23 1818  SpO2 95 % 08/03/23 1818  Vitals shown include unfiled device data.  Last Pain:  Vitals:   08/03/23 1325  TempSrc:   PainSc: 0-No pain         Complications: No notable events documented.

## 2023-08-03 NOTE — Anesthesia Procedure Notes (Signed)
Procedure Name: LMA Insertion Date/Time: 08/03/2023 4:09 PM  Performed by: Hali Marry, CRNAPre-anesthesia Checklist: Patient identified, Emergency Drugs available, Suction available and Patient being monitored Patient Re-evaluated:Patient Re-evaluated prior to induction Oxygen Delivery Method: Circle system utilized Preoxygenation: Pre-oxygenation with 100% oxygen Induction Type: IV induction LMA: LMA inserted LMA Size: 4.0 Tube type: Oral Number of attempts: 1 Airway Equipment and Method: Stylet and Oral airway Placement Confirmation: ETT inserted through vocal cords under direct vision, positive ETCO2 and breath sounds checked- equal and bilateral Tube secured with: Tape Dental Injury: Teeth and Oropharynx as per pre-operative assessment

## 2023-08-03 NOTE — Discharge Instructions (Signed)
Keep foot elevated above your heart you can use ice and on over the outside part of the distal ankle to help with pain.  See Dr. Ophelia Charter in 1 week.  Your block should wear off later this evening and if it is still numb and not hurting make sure you take a pain pill for you go to sleep.  If pain is severe you can take 2 of the hydrocodone if necessary.

## 2023-08-03 NOTE — Interval H&P Note (Signed)
History and Physical Interval Note:  08/03/2023 2:41 PM  Rhonda Guzman  has presented today for surgery, with the diagnosis of left lateral malleolar ankle fracture.  The various methods of treatment have been discussed with the patient and family. After consideration of risks, benefits and other options for treatment, the patient has consented to  Procedure(s): OPEN REDUCTION INTERNAL FIXATION (ORIF) LEFT ANKLE LATERAL MALLEOLUS FRACTURE (Left) as a surgical intervention.  The patient's history has been reviewed, patient examined, no change in status, stable for surgery.  I have reviewed the patient's chart and labs.  Questions were answered to the patient's satisfaction.     Eldred Manges

## 2023-08-03 NOTE — Anesthesia Procedure Notes (Signed)
Anesthesia Regional Block: Popliteal block   Pre-Anesthetic Checklist: , timeout performed,  Correct Patient, Correct Site, Correct Laterality,  Correct Procedure, Correct Position, site marked,  Risks and benefits discussed,  Surgical consent,  Pre-op evaluation,  At surgeon's request and post-op pain management  Laterality: Left  Prep: chloraprep       Needles:  Injection technique: Single-shot  Needle Type: Echogenic Stimulator Needle     Needle Length: 9cm  Needle Gauge: 21     Additional Needles:   Procedures:,,,, ultrasound used (permanent image in chart),,    Narrative:  Start time: 08/03/2023 2:50 PM End time: 08/03/2023 2:55 PM Injection made incrementally with aspirations every 5 mL.  Performed by: Personally  Anesthesiologist: Covington Nation, MD  Additional Notes: Discussed risks and benefits of the nerve block in detail, including but not limited vascular injury, permanent nerve damage and infection.   Patient tolerated the procedure well. Local anesthetic introduced in an incremental fashion under minimal resistance after negative aspirations. No paresthesias were elicited. After completion of the procedure, no acute issues were identified and patient continued to be monitored by RN.

## 2023-08-03 NOTE — Anesthesia Postprocedure Evaluation (Signed)
Anesthesia Post Note  Patient: [name]  Procedure(s) Performed: OPEN REDUCTION INTERNAL FIXATION (ORIF) LEFT ANKLE LATERAL MALLEOLUS FRACTURE (Left: Ankle)     Patient location during evaluation: PACU Anesthesia Type: Regional and General Level of consciousness: awake and alert Pain management: pain level controlled Vital Signs Assessment: post-procedure vital signs reviewed and stable Respiratory status: spontaneous breathing, nonlabored ventilation and respiratory function stable Cardiovascular status: blood pressure returned to baseline and stable Postop Assessment: no apparent nausea or vomiting Anesthetic complications: no  No notable events documented.  Last Vitals:  Vitals:   08/03/23 1755 08/03/23 1800  BP: 126/87 132/84  Pulse: 75 71  Resp: 20 (!) 23  Temp:    SpO2: 97% 100%    Last Pain:  Vitals:   08/03/23 1325  TempSrc:   PainSc: 0-No pain    LLE Motor Response: Purposeful movement (08/03/23 1755) LLE Sensation: Pain (08/03/23 1755)          Oluwadarasimi Favor,W. EDMOND

## 2023-08-08 ENCOUNTER — Encounter: Payer: Self-pay | Admitting: Orthopaedic Surgery

## 2023-08-08 DIAGNOSIS — S82832A Other fracture of upper and lower end of left fibula, initial encounter for closed fracture: Secondary | ICD-10-CM

## 2023-08-09 ENCOUNTER — Other Ambulatory Visit: Payer: Self-pay | Admitting: Orthopaedic Surgery

## 2023-08-09 MED ORDER — OXYCODONE-ACETAMINOPHEN 5-325 MG PO TABS: 1.0000 | ORAL_TABLET | Freq: Four times a day (QID) | ORAL | 0 refills | Status: DC | PRN

## 2023-08-11 ENCOUNTER — Encounter (HOSPITAL_COMMUNITY): Payer: Self-pay | Admitting: Orthopaedic Surgery

## 2023-08-16 ENCOUNTER — Ambulatory Visit (INDEPENDENT_AMBULATORY_CARE_PROVIDER_SITE_OTHER): Admitting: Orthopaedic Surgery

## 2023-08-16 ENCOUNTER — Other Ambulatory Visit (INDEPENDENT_AMBULATORY_CARE_PROVIDER_SITE_OTHER)

## 2023-08-16 DIAGNOSIS — M25572 Pain in left ankle and joints of left foot: Secondary | ICD-10-CM | POA: Diagnosis not present

## 2023-08-16 NOTE — Progress Notes (Signed)
   Post-Op Visit Note   Patient: Rhonda Guzman           Date of Birth: [birth year]           MRN: [id number] Visit Date: 08/16/2023 PCP: Patient, No Pcp Per   Assessment & Plan 27 year old female follow-up ORIF left lateral malleolus.  Is only been a week since surgery she is already taken a fall.  Repeat x-rays today shows alignment is maintained.  She will use her cam boot at home.  Return 1 week for probable staple removal.  Chief Complaint:  Chief Complaint  Patient presents with   Left Ankle - Routine Post Op    08/03/2023 left ankle lateral malleolus fracture   Visit Diagnoses:  1. Pain in left ankle and joints of left foot     Plan: Return 1 week for staple removal she has Tylenol and ibuprofen for pain and a few pain tablets left.  She states it was not hurting until she just recently fell fortunately x-rays show good position and alignment.  Follow-Up Instructions: No follow-ups on file.   Orders:  Orders Placed This Encounter  Procedures   XR Ankle Complete Left   No orders of the defined types were placed in this encounter.   Imaging: No results found.  PMFS History: Patient Active Problem List   Diagnosis Date Noted   Closed fracture of left distal fibula 08/02/2023   Past Medical History:  Diagnosis Date   Anginal pain (HCC)    chest pain per pt; last time- last week; "sharp pain comes and goes"   Medical history non-contributory     No family history on file.  Past Surgical History:  Procedure Laterality Date   NO PAST SURGERIES     ORIF ANKLE FRACTURE Left 08/03/2023   Procedure: OPEN REDUCTION INTERNAL FIXATION (ORIF) LEFT ANKLE LATERAL MALLEOLUS FRACTURE;  Surgeon: Eldred Manges, MD;  Location: [facility] OR;  Service: Orthopedics;  Laterality: Left;   Social History   Occupational History   Not on file  Tobacco Use   Smoking status: Never   Smokeless tobacco: Never  Vaping Use   Vaping status: Never Used  Substance and Sexual Activity    Alcohol use: Yes    Comment: occas   Drug use: No   Sexual activity: Not on file

## 2023-08-17 ENCOUNTER — Telehealth: Payer: Self-pay | Admitting: Orthopaedic Surgery

## 2023-08-17 NOTE — Telephone Encounter (Signed)
[employer] called in for [facility] and is requesting OV notes from yesterday please fax to [phone]

## 2023-08-23 ENCOUNTER — Ambulatory Visit: Payer: Self-pay | Admitting: Orthopaedic Surgery

## 2023-08-23 ENCOUNTER — Encounter: Payer: Self-pay | Admitting: Orthopaedic Surgery

## 2023-08-24 ENCOUNTER — Telehealth: Payer: Self-pay | Admitting: Orthopaedic Surgery

## 2023-08-24 NOTE — Telephone Encounter (Signed)
FYI  I spoke with patient. She states that she had to cancel her appointment yesterday due to too much stuff going on. She has rescheduled for 09/06/2023.  She was coming in for wound check and possible staple removal. She states that today, she was trying to get to the bathroom and didn't make it. Her dressing is wet. When I asked if the boot was wet, she states no.  I advised she will need to get the dressing off if it is wet. I explained she can wash the ace wrap, redress the incision with dry dressing and clean Ace. Also offered appointment to come in for nurse visit to have it changed. She said that she could come on Friday. I explained she cannot sit in a wet dressing until Friday. Explained she will need to redress her ankle in clean and dry dressing. She does not have ride to office before Friday. I advised if she is unable to clean ace wrap, to take off dirty dressing, clean area and apply new dry gauze, put on clean sock, and get back into her boot. Do not remove boot. She expressed understanding.

## 2023-08-24 NOTE — Telephone Encounter (Signed)
Pt called requesting a call back from [place]. Pt would not leave a reason for call back. Pt phone number is [phone].

## 2023-09-06 ENCOUNTER — Ambulatory Visit (INDEPENDENT_AMBULATORY_CARE_PROVIDER_SITE_OTHER): Admitting: Orthopaedic Surgery

## 2023-09-06 ENCOUNTER — Encounter: Payer: Self-pay | Admitting: Orthopaedic Surgery

## 2023-09-06 VITALS — BP 111/76 | HR 93 | Ht 67.0 in | Wt 280.0 lb

## 2023-09-06 DIAGNOSIS — S82832D Other fracture of upper and lower end of left fibula, subsequent encounter for closed fracture with routine healing: Secondary | ICD-10-CM

## 2023-09-06 NOTE — Progress Notes (Signed)
   Post-Op Visit Note   Patient: Rhonda Guzman           Date of Birth: 11/26/1995           MRN: [id number] Visit Date: 09/06/2023 PCP: Patient, No Pcp Per   Assessment & Plan: Postop ORIF left ankle.  Incision looks good.  Staples are harvested.  Work slip given no work x 8 weeks.  She will stay in her cam boot.  Recheck 4 weeks.  Chief Complaint:  Chief Complaint  Patient presents with   Left Ankle - Routine Post Op    08/03/2023 ORIF left ankle   Visit Diagnoses:  1. Other closed fracture of distal end of left fibula with routine healing, subsequent encounter     Plan: ROV 4 weeks.  Follow-Up Instructions: Return in about 4 weeks (around 10/04/2023).   Orders:  No orders of the defined types were placed in this encounter.  No orders of the defined types were placed in this encounter.   Imaging: No results found.  PMFS History: Patient Active Problem List   Diagnosis Date Noted   Closed fracture of left distal fibula 08/02/2023   Past Medical History:  Diagnosis Date   Anginal pain (HCC)    chest pain per pt; last time- last week; sharp pain comes and goes   Medical history non-contributory     No family history on file.  Past Surgical History:  Procedure Laterality Date   NO PAST SURGERIES     ORIF ANKLE FRACTURE Left 08/03/2023   Procedure: OPEN REDUCTION INTERNAL FIXATION (ORIF) LEFT ANKLE LATERAL MALLEOLUS FRACTURE;  Surgeon: Barbarann Oneil BROCKS, MD;  Location: [facility] OR;  Service: Orthopedics;  Laterality: Left;   Social History   Occupational History   Not on file  Tobacco Use   Smoking status: Never   Smokeless tobacco: Never  Vaping Use   Vaping status: Never Used  Substance and Sexual Activity   Alcohol use: Yes    Comment: occas   Drug use: No   Sexual activity: Not on file

## 2023-09-09 ENCOUNTER — Telehealth: Payer: Self-pay | Admitting: Orthopaedic Surgery

## 2023-09-09 NOTE — Telephone Encounter (Signed)
 Patient would like to have her work note emailed to her at [email or web address]. The note that was given to her on 09/06/23.

## 2023-09-12 NOTE — Telephone Encounter (Signed)
Emailed per patient request. 

## 2023-10-04 ENCOUNTER — Encounter: Payer: Self-pay | Admitting: Orthopaedic Surgery

## 2023-10-07 ENCOUNTER — Other Ambulatory Visit (INDEPENDENT_AMBULATORY_CARE_PROVIDER_SITE_OTHER)

## 2023-10-07 ENCOUNTER — Ambulatory Visit (INDEPENDENT_AMBULATORY_CARE_PROVIDER_SITE_OTHER): Admitting: Orthopaedic Surgery

## 2023-10-07 ENCOUNTER — Encounter: Payer: Self-pay | Admitting: Orthopaedic Surgery

## 2023-10-07 VITALS — BP 116/78 | HR 97

## 2023-10-07 DIAGNOSIS — S82832D Other fracture of upper and lower end of left fibula, subsequent encounter for closed fracture with routine healing: Secondary | ICD-10-CM

## 2023-10-07 NOTE — Progress Notes (Signed)
   Post-Op Visit Note   Patient: Rhonda Guzman           Date of Birth: 1996-03-26           MRN: [id number] Visit Date: 10/07/2023 PCP: Patient, No Pcp Per   Assessment & Plan: Follow-up lateral malleolar fracture.  She can stand and apply for percent weightbearing without pain.  Prescription given for folding walker without wheels.  She will turn this into [employer].  She needs physical therapy for range of motion of her ankle, progressive weightbearing starting at 50% weightbearing and progressing as well as range of motion and strengthening.  Recheck 4 weeks.  Work slip given that she can turn and that states "is light duty available modified, mostly sitting?.  If not patient remains out of work for 4 weeks.  She states her attorney needed a note that states she cannot drive since she is off her pain medicine and note was provided.  Chief Complaint:  Chief Complaint  Patient presents with   Left Ankle - Routine Post Op, Follow-up    08/03/2023 ORIF left ankle fracture   Visit Diagnoses:  1. Other closed fracture of distal end of left fibula with routine healing, subsequent encounter     Plan: Therapy, folding walker, progressive weightbearing with therapy.  She has had some falls but had the boot on. Xrays look good. ROV 4 wks  Follow-Up Instructions: No follow-ups on file.   Orders:  Orders Placed This Encounter  Procedures   XR Ankle Complete Left   No orders of the defined types were placed in this encounter.   Imaging: No results found.  PMFS History: Patient Active Problem List   Diagnosis Date Noted   Closed fracture of left distal fibula 08/02/2023   Past Medical History:  Diagnosis Date   Anginal pain (HCC)    chest pain per pt; last time- last week; "sharp pain comes and goes"   Medical history non-contributory     No family history on file.  Past Surgical History:  Procedure Laterality Date   NO PAST SURGERIES     ORIF ANKLE FRACTURE Left  08/03/2023   Procedure: OPEN REDUCTION INTERNAL FIXATION (ORIF) LEFT ANKLE LATERAL MALLEOLUS FRACTURE;  Surgeon: Eldred Manges, MD;  Location: [facility] OR;  Service: Orthopedics;  Laterality: Left;   Social History   Occupational History   Not on file  Tobacco Use   Smoking status: Never   Smokeless tobacco: Never  Vaping Use   Vaping status: Never Used  Substance and Sexual Activity   Alcohol use: Yes    Comment: occas   Drug use: No   Sexual activity: Not on file

## 2023-10-07 NOTE — Addendum Note (Signed)
Addended by: Rogers Seeds on: 10/07/2023 02:27 PM   Modules accepted: Orders

## 2023-10-13 ENCOUNTER — Telehealth: Payer: Self-pay | Admitting: Orthopaedic Surgery

## 2023-10-13 NOTE — Telephone Encounter (Signed)
 noted

## 2023-10-13 NOTE — Telephone Encounter (Signed)
 Deborrah Fam from [employer] called regarding patient Rhonda Guzman. She advised to give her a call back please. [phone]

## 2023-10-14 ENCOUNTER — Ambulatory Visit: Payer: Self-pay | Admitting: Orthopaedic Surgery

## 2023-10-17 ENCOUNTER — Telehealth: Payer: Self-pay | Admitting: Orthopaedic Surgery

## 2023-10-17 NOTE — Telephone Encounter (Signed)
 [name] (PT) from [facility] called requesting Op notes for surgery date 08/03/23. Pt has an eval appt tomorrow. Please fax as soon as possible to fax number [phone].

## 2023-10-18 NOTE — Telephone Encounter (Signed)
faxed

## 2023-10-25 ENCOUNTER — Ambulatory Visit: Payer: Medicaid Other | Admitting: Orthopaedic Surgery

## 2023-10-28 ENCOUNTER — Encounter: Payer: Self-pay | Admitting: Orthopaedic Surgery

## 2023-10-28 ENCOUNTER — Other Ambulatory Visit: Payer: Self-pay

## 2023-10-28 ENCOUNTER — Ambulatory Visit (INDEPENDENT_AMBULATORY_CARE_PROVIDER_SITE_OTHER): Admitting: Orthopaedic Surgery

## 2023-10-28 VITALS — BP 112/75 | HR 90 | Ht 67.0 in | Wt 260.0 lb

## 2023-10-28 DIAGNOSIS — S82832D Other fracture of upper and lower end of left fibula, subsequent encounter for closed fracture with routine healing: Secondary | ICD-10-CM

## 2023-10-28 NOTE — Progress Notes (Signed)
   Post-Op Visit Note   Patient: Rhonda Guzman           Date of Birth: 1996-07-25           MRN: [id number] Visit Date: 10/28/2023 PCP: Patient, No Pcp Per   Assessment & Plan: Follow-up ORIF lateral malleolus.  Surgery date 08/03/2023.  She is approaching 3 months.  We will continue physical therapy.  She still is using a rolling walker gingerly pushes it out in front of her before she follows with her left foot.  She needs to continue work on normal gait.  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  She will need to be ambulatory without any walking aids and close to normal gait before she can get back to regular work activities.  Work slip given no work x 4 weeks recheck 4 weeks.  Chief Complaint:  Chief Complaint  Patient presents with   Left Ankle - Routine Post Op, Follow-up    08/03/2023 ORIF left ankle fracture   Visit Diagnoses:  1. Other closed fracture of distal end of left fibula with routine healing, subsequent encounter     Plan: Work slip given no work x 4 weeks recheck 4 weeks.  Follow-Up Instructions: No follow-ups on file.   Orders:  Orders Placed This Encounter  Procedures   XR Ankle Complete Left   No orders of the defined types were placed in this encounter.   Imaging: No results found.  PMFS History: Patient Active Problem List   Diagnosis Date Noted   Closed fracture of left distal fibula 08/02/2023   Past Medical History:  Diagnosis Date   Anginal pain (HCC)    chest pain per pt; last time- last week; "sharp pain comes and goes"   Medical history non-contributory     No family history on file.  Past Surgical History:  Procedure Laterality Date   NO PAST SURGERIES     ORIF ANKLE FRACTURE Left 08/03/2023   Procedure: OPEN REDUCTION INTERNAL FIXATION (ORIF) LEFT ANKLE LATERAL MALLEOLUS FRACTURE;  Surgeon: Eldred Manges, MD;  Location: [facility] OR;  Service: Orthopedics;  Laterality: Left;   Social History    Occupational History   Not on file  Tobacco Use   Smoking status: Never   Smokeless tobacco: Never  Vaping Use   Vaping status: Never Used  Substance and Sexual Activity   Alcohol use: Yes    Comment: occas   Drug use: No   Sexual activity: Not on file

## 2023-11-01 ENCOUNTER — Encounter: Payer: Self-pay | Admitting: Orthopaedic Surgery

## 2023-11-04 ENCOUNTER — Ambulatory Visit: Payer: Medicaid Other | Admitting: Orthopaedic Surgery

## 2023-11-04 ENCOUNTER — Telehealth: Payer: Self-pay | Admitting: Orthopaedic Surgery

## 2023-11-04 NOTE — Telephone Encounter (Signed)
 Rhonda Guzman from [employer] is requesting the pt records from 10/28/23 office visit be sent to fax: [phone]

## 2023-11-04 NOTE — Telephone Encounter (Signed)
 faxed

## 2023-11-08 ENCOUNTER — Telehealth: Payer: Self-pay | Admitting: Orthopaedic Surgery

## 2023-11-08 NOTE — Telephone Encounter (Signed)
 Called pt and left vm. Pt need at 4 wk post op with [facility]. Pt' also is Worker's comp

## 2023-11-22 ENCOUNTER — Encounter: Payer: Self-pay | Admitting: Orthopaedic Surgery

## 2023-11-22 ENCOUNTER — Telehealth: Payer: Self-pay | Admitting: Orthopaedic Surgery

## 2023-11-22 ENCOUNTER — Ambulatory Visit (INDEPENDENT_AMBULATORY_CARE_PROVIDER_SITE_OTHER): Admitting: Orthopaedic Surgery

## 2023-11-22 VITALS — BP 110/83 | HR 84

## 2023-11-22 DIAGNOSIS — S82832D Other fracture of upper and lower end of left fibula, subsequent encounter for closed fracture with routine healing: Secondary | ICD-10-CM | POA: Diagnosis not present

## 2023-11-22 NOTE — Progress Notes (Signed)
 Office Visit Note   Patient: Rhonda Guzman           Date of Birth: 05-11-1996           MRN: [id number] Visit Date: 11/22/2023              Requested by: No referring provider defined for this encounter. PCP: Patient, No Pcp Per   Assessment & Plan: Visit Diagnoses:  1. Other closed fracture of distal end of left fibula with routine healing, subsequent encounter     Plan: Patient's fracture is healed work slip given that she can resume work 2 weeks from yesterday regular work without restrictions.  This will give her an additional almost 2 weeks to loosen up her ankle even better and improve her gait.  Discussed with her she does not need a cane.  Prescription given for lightly Ted compression and she can just wear the single sock on her left ankle to help with swelling.  Impairment for a ankle fracture satisfactory healing is 10% of the left foot.  [facility] follow-up as needed.  Follow-Up Instructions: No follow-ups on file.   Orders:  No orders of the defined types were placed in this encounter.  No orders of the defined types were placed in this encounter.     Procedures: No procedures performed   Clinical Data: No additional findings.   Subjective: Chief Complaint  Patient presents with   Left Ankle - Follow-up    08/03/2023 ORIF left ankle fracture    HPI 28 year old female now almost 4 months post ORIF lateral malleolar fracture on-the-job injury that occurred 08/03/2023.  Still has some swelling she is ambulating without a walker and out of her cam boot.  By then today she has a little bit more swelling in her leg.  Lateral incisions well-healed.  She has been out of work.  BMI greater than 40 I slowed her mobility somewhat.  Review of Systems all systems noncontributory to HPI.   Objective: Vital Signs: BP 110/83   Pulse 84   Physical Exam Constitutional:      Appearance: She is well-developed.  HENT:     Head: Normocephalic.      Right Ear: External ear normal.     Left Ear: External ear normal. There is no impacted cerumen.  Eyes:     Pupils: Pupils are equal, round, and reactive to light.  Neck:     Thyroid: No thyromegaly.     Trachea: No tracheal deviation.  Cardiovascular:     Rate and Rhythm: Normal rate.  Pulmonary:     Effort: Pulmonary effort is normal.  Abdominal:     Palpations: Abdomen is soft.  Musculoskeletal:     Cervical back: No rigidity.  Skin:    General: Skin is warm and dry.  Neurological:     Mental Status: She is alert and oriented to person, place, and time.  Psychiatric:        Behavior: Behavior normal.     Ortho Exam healed lateral incision.  Good ankle range of motion.  Negative Homans good knee range of motion.  She ambulates with slight right ankle limp significant improvement in gait from last visit.  Specialty Comments:  No specialty comments available.  Imaging: No results found.   PMFS History: Patient Active Problem List   Diagnosis Date Noted   Closed fracture of left distal fibula 08/02/2023   Past Medical History:  Diagnosis Date   Anginal  pain (HCC)    chest pain per pt; last time- last week; "sharp pain comes and goes"   Medical history non-contributory     No family history on file.  Past Surgical History:  Procedure Laterality Date   NO PAST SURGERIES     ORIF ANKLE FRACTURE Left 08/03/2023   Procedure: OPEN REDUCTION INTERNAL FIXATION (ORIF) LEFT ANKLE LATERAL MALLEOLUS FRACTURE;  Surgeon: Eldred Manges, MD;  Location: [facility] OR;  Service: Orthopedics;  Laterality: Left;   Social History   Occupational History   Not on file  Tobacco Use   Smoking status: Never   Smokeless tobacco: Never  Vaping Use   Vaping status: Never Used  Substance and Sexual Activity   Alcohol use: Yes    Comment: occas   Drug use: No   Sexual activity: Not on file

## 2023-11-22 NOTE — Telephone Encounter (Signed)
 Pt called requestinga call back. Pt states she had an appt today and meant to ask if she still need physical therapy. Please call pt at

## 2023-11-22 NOTE — Telephone Encounter (Signed)
 noted

## 2023-12-07 ENCOUNTER — Encounter: Payer: Self-pay | Admitting: Family Medicine

## 2023-12-07 ENCOUNTER — Ambulatory Visit: Admitting: Family Medicine

## 2023-12-07 VITALS — BP 119/81 | HR 81 | Temp 97.8°F | Resp 18 | Ht 67.0 in | Wt 298.5 lb

## 2023-12-07 DIAGNOSIS — Z13220 Encounter for screening for lipoid disorders: Secondary | ICD-10-CM | POA: Insufficient documentation

## 2023-12-07 DIAGNOSIS — Z7689 Persons encountering health services in other specified circumstances: Secondary | ICD-10-CM

## 2023-12-07 DIAGNOSIS — Z7251 High risk heterosexual behavior: Secondary | ICD-10-CM

## 2023-12-07 DIAGNOSIS — Z6841 Body Mass Index (BMI) 40.0 and over, adult: Secondary | ICD-10-CM

## 2023-12-07 DIAGNOSIS — E66813 Obesity, class 3: Secondary | ICD-10-CM

## 2023-12-07 NOTE — Progress Notes (Signed)
 New Patient Office Visit  Subjective    Patient ID: Rhonda Guzman, female    DOB: 1996/01/30  Age: 28 y.o. MRN: [id number]  CC:  Chief Complaint  Patient presents with   Establish Care    Patient is here to establish care with a new PCP, Patient would like to discuss weight management and would like STD testing done    HPI Rhonda Guzman presents to establish care with this practice. She is new to me.  STD testing: has a female partner, female partner years ago. No known exposure. No symptoms. Wants to get tested today.   Weight management:  Has not taken any steps to manage her weight. Does not know how many calories she is eating.  Broke her ankle in November. Has gained weight since then due to not being able to exercise. Unsure of family history of thyroid issues or diabetes.   Chart review: 11/22/23: closed fracture of distal end of left fibula  Dr. Ophelia Charter, [facility] Injury occurred 08/03/23 had ORIF on 11/27.     Outpatient Encounter Medications as of 12/07/2023  Medication Sig   [DISCONTINUED] docusate sodium (COLACE) 100 MG capsule Take 100 mg by mouth 2 (two) times daily.   [DISCONTINUED] ibuprofen (ADVIL) 200 MG tablet Take 800 mg by mouth every 8 (eight) hours as needed (pain.).   [DISCONTINUED] oxyCODONE-acetaminophen (PERCOCET/ROXICET) 5-325 MG tablet Take 1-2 tablets by mouth every 6 (six) hours as needed for severe pain (pain score 7-10). (Patient not taking: Reported on 10/07/2023)   No facility-administered encounter medications on file as of 12/07/2023.    Past Medical History:  Diagnosis Date   Anginal pain (HCC)    chest pain per pt; last time- last week; "sharp pain comes and goes"   Medical history non-contributory     Past Surgical History:  Procedure Laterality Date   ORIF ANKLE FRACTURE Left 08/03/2023   Procedure: OPEN REDUCTION INTERNAL FIXATION (ORIF) LEFT ANKLE LATERAL MALLEOLUS FRACTURE;  Surgeon: Eldred Manges, MD;  Location: [facility] OR;   Service: Orthopedics;  Laterality: Left;    History reviewed. No pertinent family history.  Social History   Socioeconomic History   Marital status: Single    Spouse name: Not on file   Number of children: 0   Years of education: Not on file   Highest education level: Not on file  Occupational History   Not on file  Tobacco Use   Smoking status: Never    Passive exposure: Never   Smokeless tobacco: Never  Vaping Use   Vaping status: Never Used  Substance and Sexual Activity   Alcohol use: Yes    Comment: occas   Drug use: Never   Sexual activity: Yes    Partners: Female  Other Topics Concern   Not on file  Social History Narrative   Not on file   Social Drivers of Health   Financial Resource Strain: Not on file  Food Insecurity: Not on file  Transportation Needs: Not on file  Physical Activity: Not on file  Stress: Not on file (11/28/2019)  Social Connections: Not on file  Intimate Partner Violence: Not At Risk (07/24/2023)   Received from [facility]   HITS    Over the last 12 months how often did your partner physically hurt you?: Never    Over the last 12 months how often did your partner insult you or talk down to you?: Never    Over the last 12 months how often  did your partner threaten you with physical harm?: Never    Over the last 12 months how often did your partner scream or curse at you?: Never    Review of Systems  Constitutional:  Negative for malaise/fatigue.  Respiratory:  Positive for shortness of breath (with exertion, attributes to being overweight).   Cardiovascular:  Negative for chest pain.        Objective    BP 119/81   Pulse 81   Temp 97.8 F (36.6 C) (Oral)   Resp 18   Ht 5\' 7"  (1.702 m)   Wt 298 lb 8 oz (135.4 kg)   LMP 12/07/2023 (Exact Date)   SpO2 100%   BMI 46.75 kg/m   Physical Exam Vitals and nursing note reviewed.  Constitutional:      General: She is not in acute distress.    Appearance: Normal appearance.  She is obese.  Cardiovascular:     Rate and Rhythm: Normal rate and regular rhythm.     Heart sounds: Normal heart sounds.  Pulmonary:     Effort: Pulmonary effort is normal.     Breath sounds: Normal breath sounds.  Skin:    General: Skin is warm and dry.  Neurological:     General: No focal deficit present.     Mental Status: She is alert. Mental status is at baseline.  Psychiatric:        Mood and Affect: Mood normal.        Behavior: Behavior normal.        Thought Content: Thought content normal.        Judgment: Judgment normal.        Assessment & Plan:   Problem List Items Addressed This Visit     Establishing care with new doctor, encounter for - Primary   Unprotected sexual intercourse   No known exposures. Female partner. No symptoms. Wants testing today to be sure.       Relevant Orders   Chlamydia/Gonococcus/Trichomonas, NAA   Hepatitis C antibody   HIV Antibody (routine testing w rflx)   RPR   Class 3 severe obesity due to excess calories without serious comorbidity with body mass index (BMI) of 45.0 to 49.9 in adult [facility])   Interested in losing weight. Has not made any efforts towards that goal before today. Recent broken ankle and surgery, has not been able to exercise and has gained more weight. Will get labs today to rule out physical causes of weight gain. CMP, A1C, TSH, T4.  Informed of [facility] services. Next steps to be determined.        Relevant Orders   Comprehensive metabolic panel with GFR   Hemoglobin A1c   TSH + free T4  Agrees with plan of care discussed.  Questions answered.   Return in about 1 week (around 12/14/2023) for CPE with labs with pap .   Novella Olive, FNP

## 2023-12-07 NOTE — Assessment & Plan Note (Addendum)
 Interested in losing weight. Has not made any efforts towards that goal before today. Recent broken ankle and surgery, has not been able to exercise and has gained more weight. Will get labs today to rule out physical causes of weight gain. CMP, A1C, TSH, T4.  Informed of [facility] services. Next steps to be determined.

## 2023-12-07 NOTE — Assessment & Plan Note (Signed)
 No known exposures. Female partner. No symptoms. Wants testing today to be sure.

## 2023-12-08 ENCOUNTER — Encounter: Payer: Self-pay | Admitting: Family Medicine

## 2023-12-08 LAB — COMPREHENSIVE METABOLIC PANEL WITH GFR
ALT: 11 IU/L (ref 0–32)
AST: 13 IU/L (ref 0–40)
Albumin: 3.9 g/dL — ABNORMAL LOW (ref 4.0–5.0)
Alkaline Phosphatase: 68 IU/L (ref 44–121)
BUN/Creatinine Ratio: 18 (ref 9–23)
BUN: 12 mg/dL (ref 6–20)
Bilirubin Total: <0.2 mg/dL (ref 0.0–1.2)
CO2: 22 mmol/L (ref 20–29)
Calcium: 9 mg/dL (ref 8.7–10.2)
Chloride: 104 mmol/L (ref 96–106)
Creatinine, Ser: 0.67 mg/dL (ref 0.57–1.00)
Globulin, Total: 3.3 g/dL (ref 1.5–4.5)
Glucose: 76 mg/dL (ref 70–99)
Potassium: 4.4 mmol/L (ref 3.5–5.2)
Sodium: 140 mmol/L (ref 134–144)
Total Protein: 7.2 g/dL (ref 6.0–8.5)
eGFR: 122 mL/min/{1.73_m2} (ref >59)

## 2023-12-08 LAB — TSH+FREE T4
Free T4: 1.02 ng/dL (ref 0.82–1.77)
TSH: 1.16 u[IU]/mL (ref 0.450–4.500)

## 2023-12-08 LAB — HIV ANTIBODY (ROUTINE TESTING W REFLEX): HIV Screen 4th Generation wRfx: NONREACTIVE

## 2023-12-08 LAB — RPR: RPR Ser Ql: NONREACTIVE

## 2023-12-08 LAB — HEPATITIS C ANTIBODY: Hep C Virus Ab: NONREACTIVE

## 2023-12-08 LAB — HEMOGLOBIN A1C
Est. average glucose Bld gHb Est-mCnc: 108 mg/dL
Hgb A1c MFr Bld: 5.4 % (ref 4.8–5.6)

## 2023-12-10 LAB — CHLAMYDIA/GONOCOCCUS/TRICHOMONAS, NAA
Chlamydia by NAA: NEGATIVE
Gonococcus by NAA: NEGATIVE
Trich vag by NAA: POSITIVE — AB

## 2023-12-11 ENCOUNTER — Other Ambulatory Visit: Payer: Self-pay | Admitting: Family Medicine

## 2023-12-11 DIAGNOSIS — A599 Trichomoniasis, unspecified: Secondary | ICD-10-CM | POA: Insufficient documentation

## 2023-12-11 MED ORDER — METRONIDAZOLE 500 MG PO TABS: 500.0000 mg | ORAL_TABLET | Freq: Two times a day (BID) | ORAL | 0 refills | Status: AC

## 2023-12-15 ENCOUNTER — Encounter: Admitting: Family Medicine

## 2023-12-21 ENCOUNTER — Encounter: Payer: Self-pay | Admitting: Family Medicine

## 2023-12-21 ENCOUNTER — Ambulatory Visit (INDEPENDENT_AMBULATORY_CARE_PROVIDER_SITE_OTHER): Admitting: Family Medicine

## 2023-12-21 VITALS — BP 129/81 | HR 84 | Temp 98.0°F | Resp 18 | Ht 67.0 in | Wt 297.7 lb

## 2023-12-21 DIAGNOSIS — Z0000 Encounter for general adult medical examination without abnormal findings: Secondary | ICD-10-CM

## 2023-12-21 DIAGNOSIS — Z136 Encounter for screening for cardiovascular disorders: Secondary | ICD-10-CM

## 2023-12-21 DIAGNOSIS — Z8619 Personal history of other infectious and parasitic diseases: Secondary | ICD-10-CM | POA: Diagnosis not present

## 2023-12-21 DIAGNOSIS — Z13220 Encounter for screening for lipoid disorders: Secondary | ICD-10-CM | POA: Diagnosis not present

## 2023-12-21 DIAGNOSIS — Z124 Encounter for screening for malignant neoplasm of cervix: Secondary | ICD-10-CM | POA: Insufficient documentation

## 2023-12-21 NOTE — Progress Notes (Signed)
 Complete physical exam  Patient: Rhonda Guzman   DOB: [birth year]   28 y.o. Female  MRN: [id number]  Subjective:    Chief Complaint  Patient presents with   Annual Exam   Gynecologic Exam    Rhonda Guzman is a 28 y.o. female who presents today for a complete physical exam. She reports consuming a general diet.  Walking more  She generally feels well. She reports sleeping well. She does not have additional problems to discuss today.   Requesting to retest for STDs due to recent infection.    Most recent fall risk assessment:    12/07/2023    2:55 PM  Fall Risk   Falls in the past year? 1  Number falls in past yr: 1  Injury with Fall? 1  Risk for fall due to : No Fall Risks  Follow up Falls evaluation completed     Most recent depression screenings:    12/07/2023    2:56 PM  PHQ 2/9 Scores  PHQ - 2 Score 2  PHQ- 9 Score 5    Vision:Within last year and Dental: No current dental problems and Receives regular dental care    Patient Care Team: Novella Olive, FNP as PCP - General (Family Medicine)   No outpatient medications prior to visit.   No facility-administered medications prior to visit.    ROS        Objective:     BP 129/81   Pulse 84   Temp 98 F (36.7 C) (Oral)   Resp 18   Ht 5\' 7"  (1.702 m)   Wt 297 lb 11.2 oz (135 kg)   LMP 12/07/2023 (Exact Date)   SpO2 100%   BMI 46.63 kg/m  BP Readings from Last 3 Encounters:  12/21/23 129/81  12/07/23 119/81  11/22/23 110/83      Physical Exam Vitals and nursing note reviewed. Exam conducted with a chaperone present.  Constitutional:      General: She is not in acute distress.    Appearance: Normal appearance.  HENT:     Right Ear: Tympanic membrane normal.     Left Ear: Tympanic membrane normal.     Nose: Nose normal.     Mouth/Throat:     Mouth: Mucous membranes are moist.     Pharynx: Oropharynx is clear.  Eyes:     Extraocular Movements: Extraocular movements intact.   Neck:     Thyroid: No thyroid tenderness.  Cardiovascular:     Rate and Rhythm: Normal rate and regular rhythm.     Pulses:          Radial pulses are 2+ on the right side and 2+ on the left side.     Heart sounds: Normal heart sounds, S1 normal and S2 normal.  Pulmonary:     Effort: Pulmonary effort is normal.     Breath sounds: Normal breath sounds.  Abdominal:     General: Bowel sounds are normal.     Palpations: Abdomen is soft.     Tenderness: There is no abdominal tenderness.  Genitourinary:    General: Normal vulva.     Exam position: Lithotomy position.     Vagina: Normal.     Cervix: Normal.     Adnexa: Right adnexa normal and left adnexa normal.  Musculoskeletal:        General: Normal range of motion.     Cervical back: Normal range of motion.     Right lower leg:  No edema.     Left lower leg: No edema.  Lymphadenopathy:     Cervical:     Right cervical: No superficial cervical adenopathy.    Left cervical: No superficial cervical adenopathy.  Skin:    General: Skin is warm and dry.  Neurological:     General: No focal deficit present.     Mental Status: She is alert. Mental status is at baseline.  Psychiatric:        Mood and Affect: Mood normal.        Behavior: Behavior normal.        Thought Content: Thought content normal.        Judgment: Judgment normal.      No results found for any visits on 12/21/23.     Assessment & Plan:    Routine Health Maintenance and Physical Exam  Immunization History  Administered Date(s) Administered   DTaP 01/24/1997, 02/19/1999, 06/16/1999, 08/12/2000   HIB (PRP-OMP) 06/16/1999   HIB, Unspecified 01/24/1997, 02/19/1999   HPV 9-valent 05/29/2014   HPV Quadrivalent 06/01/2007, 05/12/2011   Hep A, Unspecified 06/01/2007   Hep B, Unspecified 12/13/1995, 01/24/1997   Hepatitis A, Ped/Adol-2 Dose 05/12/2011   Hepatitis B, PED/ADOLESCENT 04/01/2006   IPV 02/19/1999, 06/16/1999, 08/12/2000   Influenza Nasal  06/27/2009   MMR 01/24/1997, 08/12/2000   Meningococcal Acwy, Unspecified 06/01/2007   Meningococcal Conjugate 05/29/2014   Novel Infuenza-h1n1-09 06/26/2008   OPV 01/24/1997   PPD Test 10/04/2022   Tdap 06/01/2007    Health Maintenance  Topic Date Due   Cervical Cancer Screening (Pap smear)  Never done   DTaP/Tdap/Td (6 - Td or Tdap) 05/31/2017   COVID-19 Vaccine (1 - 2024-25 season) Never done   INFLUENZA VACCINE  04/06/2024   HPV VACCINES  Completed   Hepatitis C Screening  Completed   HIV Screening  Completed   Meningococcal B Vaccine  Aged Out    Discussed health benefits of physical activity, and encouraged her to engage in regular exercise appropriate for her age and condition.  Problem List Items Addressed This Visit     Encounter for lipid screening for cardiovascular disease - Primary   Relevant Orders   Lipid panel   History of trichomoniasis   Relevant Orders   Chlamydia/Gonococcus/Trichomonas, NAA   Screening for cervical cancer   Relevant Orders   Pap LB (liquid-based)    Routine labs ordered. Not fasting, will return for lipid panel.  HCM reviewed/discussed. Will retest  urine for STDs due to recent infection.  Anticipatory guidance regarding healthy weight, lifestyle and choices given. Recommend healthy diet.  Recommend approximately 150 minutes/week of moderate intensity exercise. Resistance training is good for building muscles and for bone health. Muscle mass helps to increase our metabolism and to burn more calories at rest.  Limit alcohol consumption: no more than one drink per day for women and 2 drinks per day for me. Recommend regular dental and vision exams. Always use seatbelt/lap and shoulder restraints. Recommend using smoke alarms and checking batteries at least twice a year. Recommend using sunscreen when outside.  Please know that I am here to help you with all of your health care goals and am happy to work with you to find a solution that  works best for you.  The greatest advice I have received with any changes in life are to take it one step at a time, that even means if all you can focus on is the next 60 seconds, then do that  and celebrate your victories.  With any changes in life, you will have set backs, and that is OK. The important thing to remember is, if you have a set back, it is not a failure, it is an opportunity to try again! Agrees with plan of care discussed.  Questions answered.      Return in about 1 year (around 12/20/2024) for CPE with labs.     Mickiel Albany, FNP

## 2023-12-23 LAB — CHLAMYDIA/GONOCOCCUS/TRICHOMONAS, NAA
Chlamydia by NAA: NEGATIVE
Gonococcus by NAA: NEGATIVE
Trich vag by NAA: NEGATIVE

## 2023-12-25 ENCOUNTER — Encounter: Payer: Self-pay | Admitting: Family Medicine

## 2023-12-26 ENCOUNTER — Ambulatory Visit

## 2023-12-27 LAB — PAP LB (LIQUID-BASED): PAP Smear Comment: 0

## 2024-02-23 ENCOUNTER — Ambulatory Visit: Admitting: Family Medicine

## 2024-03-16 ENCOUNTER — Encounter: Payer: Self-pay | Admitting: Family Medicine

## 2024-03-16 ENCOUNTER — Ambulatory Visit: Admitting: Family Medicine

## 2024-03-16 VITALS — BP 110/77 | HR 82 | Temp 98.8°F | Ht 67.0 in | Wt 294.0 lb

## 2024-03-16 DIAGNOSIS — L089 Local infection of the skin and subcutaneous tissue, unspecified: Secondary | ICD-10-CM | POA: Diagnosis not present

## 2024-03-16 MED ORDER — MUPIROCIN 2 % EX OINT: 1.0000 | TOPICAL_OINTMENT | Freq: Two times a day (BID) | CUTANEOUS | 0 refills | Status: DC

## 2024-03-16 NOTE — Assessment & Plan Note (Addendum)
 Left great toe with purulent drainage per video. She went to urgent care and completed an unknown antibiotic. Symptoms have improved. No purulence today. There is concern for lingering infection.  Apply mupirocin  2% ointment BID after cleaning with soap and water. Follow-up if symptoms do not resolve.

## 2024-03-16 NOTE — Patient Instructions (Signed)
 Apply ointment twice per day. Cleanse area with soap and water before using ointment.

## 2024-03-16 NOTE — Progress Notes (Signed)
   Acute Office Visit  Subjective:     Patient ID: Rhonda Guzman, female    DOB: [birth year], 28 y.o.   MRN: [id number]  Chief Complaint  Patient presents with   Wound Infection    Infection of big left toe    HPI Patient is in today for infection of big on left.  Went to urgent care in [place]. Completed antibiotic, unsure of which one.  Video of toe showed pus coming from wound which has resolved. There is blood on lateral aspect of nail.    ROS      Objective:    BP 110/77 (BP Location: Left Arm, Patient Position: Sitting, Cuff Size: Large)   Pulse 82   Temp 98.8 F (37.1 C) (Oral)   Ht 5' 7 (1.702 m)   Wt 294 lb (133.4 kg)   LMP 03/03/2024 (Approximate)   SpO2 97%   BMI 46.05 kg/m    Physical Exam Vitals and nursing note reviewed.  Constitutional:      Appearance: Normal appearance.  Cardiovascular:     Rate and Rhythm: Normal rate.  Pulmonary:     Effort: Pulmonary effort is normal.  Feet:     Comments: Left great toe infection improved, no pus.   Skin:    General: Skin is warm and dry.  Neurological:     General: No focal deficit present.     Mental Status: She is alert. Mental status is at baseline.  Psychiatric:        Mood and Affect: Mood normal.        Behavior: Behavior normal.        Thought Content: Thought content normal.        Judgment: Judgment normal.     No results found for any visits on 03/16/24.      Assessment & Plan:   Problem List Items Addressed This Visit     Infection of great toe - Primary   Left great toe with purulent drainage per video. She went to urgent care and completed an unknown antibiotic. Symptoms have improved. No purulence today. There is concern for lingering infection.  Apply mupirocin  2% ointment BID after cleaning with soap and water. Follow-up if symptoms do not resolve.        Relevant Medications   mupirocin  ointment (BACTROBAN ) 2 %    Meds ordered this encounter  Medications    mupirocin  ointment (BACTROBAN ) 2 %    Sig: Apply 1 Application topically 2 (two) times daily.    Dispense:  15 g    Refill:  0    Supervising Provider:   COLETTE TORRENCE GRADE [[id number]]  Agrees with plan of care discussed.  Questions answered.   No follow-ups on file.  Darice JONELLE Brownie, FNP

## 2024-03-29 ENCOUNTER — Ambulatory Visit: Admitting: Family Medicine

## 2024-04-20 ENCOUNTER — Encounter: Payer: Self-pay | Admitting: Family Medicine

## 2024-04-20 ENCOUNTER — Telehealth: Payer: Self-pay

## 2024-04-20 ENCOUNTER — Ambulatory Visit: Admitting: Family Medicine

## 2024-04-20 VITALS — BP 103/71 | HR 73 | Temp 98.7°F | Ht 67.0 in | Wt 296.0 lb

## 2024-04-20 DIAGNOSIS — Z6841 Body Mass Index (BMI) 40.0 and over, adult: Secondary | ICD-10-CM

## 2024-04-20 DIAGNOSIS — E66813 Obesity, class 3: Secondary | ICD-10-CM

## 2024-04-20 MED ORDER — WEGOVY 0.25 MG/0.5ML ~~LOC~~ SOAJ: 0.2500 mg | SUBCUTANEOUS | 0 refills | Status: DC

## 2024-04-20 NOTE — Telephone Encounter (Signed)
 Prior auth for: WEGOVY   Determination: PENDING Auth #: [id number] Valid from: N/A Patient notified via MyChart

## 2024-04-20 NOTE — Progress Notes (Signed)
   Established Patient Office Visit  Subjective   Patient ID: Rhonda Guzman, female    DOB: 1995/10/08  Age: 28 y.o. MRN: [id number]  Chief Complaint  Patient presents with   Weight Loss    Presents today to discuss and start Wegovy . It is a covered benefit with her insurance.  No history of thyroid cancer.  BMI: 46.3 today.       ROS    Objective:     BP 103/71 (BP Location: Left Arm, Patient Position: Sitting, Cuff Size: Large)   Pulse 73   Temp 98.7 F (37.1 C) (Oral)   Ht 5' 7 (1.702 m)   Wt 296 lb (134.3 kg)   LMP 04/19/2024 (Exact Date)   SpO2 97%   BMI 46.36 kg/m    Physical Exam Vitals and nursing note reviewed.  Constitutional:      Appearance: Normal appearance. She is obese.  Cardiovascular:     Rate and Rhythm: Normal rate and regular rhythm.     Heart sounds: Normal heart sounds.  Pulmonary:     Effort: Pulmonary effort is normal.     Breath sounds: Normal breath sounds.  Skin:    General: Skin is warm and dry.  Neurological:     General: No focal deficit present.     Mental Status: She is alert. Mental status is at baseline.  Psychiatric:        Mood and Affect: Mood normal.        Behavior: Behavior normal.        Thought Content: Thought content normal.        Judgment: Judgment normal.      No results found for any visits on 04/20/24.    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 Wegovy  is a covered benefit with her employer. Discussed healthy eating and exercise to include resistance training to prevent lean muscle mass loss.  Discussed common side  effects. Information provided on AVS.  Recommend mediterranean diet with lots of produce, lean proteins, and healthy fats. Wegovy  0.25 mg weekly. Follow-up in 3 weeks for medication management.      Relevant Medications   semaglutide -weight management (WEGOVY ) 0.25 MG/0.5ML SOAJ SQ injection  Agrees with plan of care discussed.  Questions answered.   Return in about 24 days (around 05/14/2024) for weight loss medication med managment .    Darice JONELLE Brownie, FNP

## 2024-04-20 NOTE — Assessment & Plan Note (Signed)
 Ready to start a weight loss journey. Wegovy  is a covered benefit with her employer. Discussed healthy eating and exercise to include resistance training to prevent lean muscle mass loss.  Discussed common side effects. Information provided on AVS.  Recommend mediterranean diet with lots of produce, lean proteins, and healthy fats. Wegovy  0.25 mg weekly. Follow-up in 3 weeks for medication management.

## 2024-04-24 ENCOUNTER — Ambulatory Visit: Admitting: Family

## 2024-05-14 ENCOUNTER — Ambulatory Visit: Admitting: Family Medicine

## 2024-05-14 ENCOUNTER — Encounter: Payer: Self-pay | Admitting: Family Medicine

## 2024-05-14 VITALS — BP 126/84 | HR 77 | Temp 98.3°F | Ht 67.0 in | Wt 293.0 lb

## 2024-05-14 DIAGNOSIS — E66813 Obesity, class 3: Secondary | ICD-10-CM | POA: Diagnosis not present

## 2024-05-14 DIAGNOSIS — Z6841 Body Mass Index (BMI) 40.0 and over, adult: Secondary | ICD-10-CM

## 2024-05-14 DIAGNOSIS — E661 Drug-induced obesity: Secondary | ICD-10-CM | POA: Diagnosis not present

## 2024-05-14 MED ORDER — SEMAGLUTIDE-WEIGHT MANAGEMENT 0.5 MG/0.5ML ~~LOC~~ SOAJ: 0.5000 mg | SUBCUTANEOUS | 0 refills | Status: DC

## 2024-05-14 NOTE — Progress Notes (Signed)
   Established Patient Office Visit  Subjective   Patient ID: Rhonda Guzman, female    DOB: May 26, 1996  Age: 28 y.o. MRN: [id number]  Chief Complaint  Patient presents with   Weight Loss    Mgmt    TakWing Wegovy  0.25 mg weekly. Tolerating well. No nausea or vomiting or abdominal pain.  Wants in increase dose today.  Total weight loss: 3 pounds  Walking for exercise. Discussed including resistance training to avoid lean muscle mass loss.      ROS    Objective:     BP 126/84   Pulse 77   Temp 98.3 F (36.8 C) (Oral)   Ht 5' 7 (1.702 m)   Wt 293 lb (132.9 kg)   LMP 04/19/2024 (Exact Date)   SpO2 99%   BMI 45.89 kg/m    Physical Exam Vitals and nursing note reviewed.  Constitutional:      Appearance: Normal appearance. She is obese.  Cardiovascular:     Rate and Rhythm: Normal rate and regular rhythm.     Heart sounds: Normal heart sounds.  Pulmonary:     Effort: Pulmonary effort is normal.     Breath sounds: Normal breath sounds.  Skin:    General: Skin is warm and dry.  Neurological:     General: No focal deficit present.     Mental Status: She is alert. Mental status is at baseline.  Psychiatric:        Mood and Affect: Mood normal.        Behavior: Behavior normal.        Thought Content: Thought content normal.        Judgment: Judgment normal.      No results found for any visits on 05/14/24.    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 Tolerating well without nausea, vomiting, or abdominal pain. Reports decreased appetite and getting full faster. Total weight loss: 3 pounds. Walking. Discussed  adding weight training to reduce lean muscle loss. Wegovy  0.5 mg weekly. Follow-up in 4 weeks.        Relevant Medications   semaglutide -weight management (WEGOVY ) 0.5 MG/0.5ML SOAJ SQ injection  Agrees with plan of care discussed.  Questions answered.   Return in about 4 weeks (around 06/11/2024) for obesity .    Darice JONELLE Brownie, FNP

## 2024-05-14 NOTE — Assessment & Plan Note (Signed)
 Taking Wegovy  0.25 mg weekly. Tolerating well without nausea, vomiting, or abdominal pain. Reports decreased appetite and getting full faster. Total weight loss: 3 pounds. Walking. Discussed adding weight training to reduce lean muscle loss. Wegovy  0.5 mg weekly. Follow-up in 4 weeks.

## 2024-05-14 NOTE — Patient Instructions (Signed)
 Include weight training at least twice per week to avoid losing muscle mass.

## 2024-05-22 NOTE — Telephone Encounter (Signed)
 Prior auth for: WEGOVY           Determination: APPROVED Auth #: [id number] Valid from: PA Case: [id number], Status: Approved, Coverage Starts on: 04/20/2024 12:00:00 AM, Coverage Ends on: 10/17/2024 12:00:00 AM. Patient notified via MyChart

## 2024-06-21 ENCOUNTER — Other Ambulatory Visit: Payer: Self-pay | Admitting: Family Medicine

## 2024-06-21 ENCOUNTER — Ambulatory Visit: Admitting: Family Medicine

## 2024-06-21 DIAGNOSIS — Z6841 Body Mass Index (BMI) 40.0 and over, adult: Secondary | ICD-10-CM

## 2024-06-21 MED ORDER — SEMAGLUTIDE-WEIGHT MANAGEMENT 0.5 MG/0.5ML ~~LOC~~ SOAJ: 0.5000 mg | SUBCUTANEOUS | 0 refills | Status: DC
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
## 2024-06-21 NOTE — Telephone Encounter (Signed)
 Copied from CRM [id number]. Topic: Clinical - Medication Refill >> Jun 21, 2024  4:10 PM Leah C wrote: Medication: semaglutide -weight management (WEGOVY ) 0.5 MG/0.5ML SOAJ SQ injection  Has the patient contacted their pharmacy? Patient went to pick up prescription and they stated that they needed the provider authorization.   This is the patient's preferred pharmacy:  [facility] [address], [place] - [address] [place] [address] Phone: [phone] Fax: [phone]   Is this the correct pharmacy for this prescription? Yes If no, delete pharmacy and type the correct one.   Has the prescription been filled recently? No  Is the patient out of the medication? Yes  Has the patient been seen for an appointment in the last year OR does the patient have an upcoming appointment? Yes   Can we respond through MyChart? Yes   Agent: Please be advised that Rx refills may take up to 3 business days. We ask that you follow-up with your pharmacy.

## 2024-06-25 ENCOUNTER — Encounter: Payer: Self-pay | Admitting: Family Medicine

## 2024-06-25 NOTE — Telephone Encounter (Signed)
 Await PA   Copied from CRM #[id number]. Topic: Clinical - Medication Prior Auth >> Jun 25, 2024  4:21 PM Lauren C wrote: Reason for CRM: Prior auth needed for semaglutide -weight management (WEGOVY ) 0.5 MG/0.5ML SOAJ SQ injection. She confirms her primary insurance is [employer].   Pharmacy: [facility] [address] [place] - [address] [place] [address] Phone: [phone] Fax: [phone]

## 2024-06-25 NOTE — Telephone Encounter (Signed)
 Refill sent per provider on 06/21/24 Will need in person visit for next refill.    Copied from CRM [id number]. Topic: Clinical - Medication Question >> Jun 25, 2024 10:34 AM Rhonda Guzman wrote: Reason for CRM: Pt calling in regarding updates on Wegovy  medication. She was advised PCP has to respond and give authorization for medication. It shows Reason for Refusal: Request already responded to by other means (e.g. phone or fax)   Please advise #[phone]

## 2024-07-02 NOTE — Telephone Encounter (Unsigned)
 Copied from CRM [id number]. Topic: Clinical - Medication Prior Auth >> Jul 02, 2024  9:42 AM Treva T wrote: Reason for CRM: Patient is calling inquiring on PA status of medication, Wegovy .  Per patient states has been without medication for several weeks.  Requesting a return call to advise further with a status update, can be reached at [phone]. Patient is aware of same day call back.    [facility] [address] [place] - [address] [place] [address] Phone: [phone] Fax: [phone]

## 2024-07-09 ENCOUNTER — Encounter: Payer: Self-pay | Admitting: Radiology

## 2024-07-11 ENCOUNTER — Encounter: Payer: Self-pay | Admitting: Family Medicine

## 2024-07-11 ENCOUNTER — Ambulatory Visit: Admitting: Family Medicine

## 2024-07-11 VITALS — BP 134/76 | HR 79 | Temp 97.9°F | Ht 67.0 in | Wt 291.0 lb

## 2024-07-11 DIAGNOSIS — E661 Drug-induced obesity: Secondary | ICD-10-CM | POA: Diagnosis not present

## 2024-07-11 DIAGNOSIS — E66813 Obesity, class 3: Secondary | ICD-10-CM

## 2024-07-11 DIAGNOSIS — Z6841 Body Mass Index (BMI) 40.0 and over, adult: Secondary | ICD-10-CM

## 2024-07-11 MED ORDER — PHENTERMINE HCL 37.5 MG PO TABS: 37.5000 mg | ORAL_TABLET | Freq: Every day | ORAL | 0 refills | Status: AC

## 2024-07-11 NOTE — Progress Notes (Signed)
   Established Patient Office Visit  Subjective   Patient ID: Rhonda Guzman, female    DOB: 06-Sep-1996  Age: 28 y.o. MRN: [id number]  Chief Complaint  Patient presents with   Obesity    Wegovy  not covered by insurance. Would like to discuss other options.    Was taking Wegovy  until insurance stopped covering. She was losing weight.  Total weight loss: 20 pounds Trying to maintain this weight loss. Interested in trying something else to curb appetite. No cardiac history. No heart palpitations. Discussed diet, exercise, calorie restriction to meet weight loss  goals while using phentermine as a tool.       ROS    Objective:     BP 134/76 (BP Location: Left Arm, Patient Position: Sitting, Cuff Size: Large)   Pulse 79   Temp 97.9 F (36.6 C) (Oral)   Ht 5' 7 (1.702 m)   Wt 291 lb (132 kg)   LMP 07/07/2024 (Exact Date)   SpO2 100%   BMI 45.58 kg/m    Physical Exam Vitals and nursing note reviewed.  Constitutional:      Appearance: Normal appearance. She is obese.  Cardiovascular:     Rate and Rhythm: Normal rate.  Pulmonary:     Effort: Pulmonary effort is normal.     Breath sounds: Normal breath sounds.  Skin:    General: Skin is warm and dry.  Neurological:     General: No focal deficit present.     Mental Status: She is alert. Mental status is at baseline.  Psychiatric:        Mood and Affect: Mood normal.        Behavior: Behavior normal.        Thought Content: Thought content normal.        Judgment: Judgment normal.      No results found for any visits on 07/11/24.    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 Has lost 20 pounds. Phentermine 37.5 mg daily. No known contraindications.  Understands this is a short term solution to lower appetite. Lifestyle changes discussed. Encouraged exercise daily. Follow-up in 3 months.       Relevant Medications   phentermine (ADIPEX-P) 37.5 MG tablet  Agrees with plan of care discussed.  Questions answered.   Return in about 3 months (around 10/11/2024) for obesity .    Darice JONELLE Brownie, FNP

## 2024-07-11 NOTE — Assessment & Plan Note (Addendum)
 Wegovy  no longer a covered benefit with insurance. Has lost 20 pounds. Phentermine 37.5 mg daily. No known contraindications.  Understands this is a short term solution to lower appetite. Lifestyle changes discussed. Encouraged exercise daily. Follow-up in 3 months.

## 2024-08-23 ENCOUNTER — Other Ambulatory Visit: Payer: Self-pay | Admitting: Family Medicine

## 2024-08-23 DIAGNOSIS — E66813 Obesity, class 3: Secondary | ICD-10-CM

## 2024-08-23 NOTE — Telephone Encounter (Unsigned)
 Copied from CRM #[id number]. Topic: Clinical - Medication Refill >> Aug 23, 2024 12:13 PM Roselie BROCKS wrote: Medication: phentermine  (ADIPEX-P ) 37.5 MG tablet  Has the patient contacted their pharmacy? No (Agent: If no, request that the patient contact the pharmacy for the refill. If patient does not wish to contact the pharmacy document the reason why and proceed with request.) (Agent: If yes, when and what did the pharmacy advise?)  This is the patient's preferred pharmacy:  [facility] [address], [place] - [address] [place]  [place] [address] Phone: [phone] Fax: [phone]   Is this the correct pharmacy for this prescription? Yes If no, delete pharmacy and type the correct one.   Has the prescription been filled recently? No  Is the patient out of the medication? Yes  Has the patient been seen for an appointment in the last year OR does the patient have an upcoming appointment? Yes  Can we respond through MyChart? Yes  Agent: Please be advised that Rx refills may take up to 3 business days. We ask that you follow-up with your pharmacy.

## 2024-08-24 MED ORDER — PHENTERMINE HCL 37.5 MG PO TABS: 37.5000 mg | ORAL_TABLET | Freq: Every day | ORAL | 0 refills | Status: DC

## 2024-09-11 ENCOUNTER — Encounter: Payer: Self-pay | Admitting: Family Medicine

## 2024-09-11 ENCOUNTER — Ambulatory Visit: Admitting: Family Medicine

## 2024-09-11 VITALS — BP 118/76 | HR 118 | Temp 98.0°F | Ht 67.0 in | Wt 269.0 lb

## 2024-09-11 DIAGNOSIS — J101 Influenza due to other identified influenza virus with other respiratory manifestations: Secondary | ICD-10-CM | POA: Insufficient documentation

## 2024-09-11 DIAGNOSIS — R52 Pain, unspecified: Secondary | ICD-10-CM | POA: Insufficient documentation

## 2024-09-11 MED ORDER — IBUPROFEN 800 MG PO TABS: 800.0000 mg | ORAL_TABLET | Freq: Three times a day (TID) | ORAL | 0 refills | Status: AC | PRN

## 2024-09-11 MED ORDER — BENZONATATE 100 MG PO CAPS: 100.0000 mg | ORAL_CAPSULE | Freq: Two times a day (BID) | ORAL | 0 refills | Status: AC | PRN

## 2024-09-11 NOTE — Progress Notes (Signed)
 "  Acute Office Visit  Subjective:     Patient ID: Rhonda Guzman, female    DOB: 1996/04/07, 29 y.o.   MRN: [id number]  Chief Complaint  Patient presents with   Influenza    Patient diagnosed with flu A 09/08/24. Still having chills, body aches and dizziness. States was not given any medications.     HPI Patient is in today for follow-up from flu diagnosis on 1/3. Decided to not get Tamiflu at that time. Continues to have body aches. Tylenol  helping some. Resting and drinking fluids. Supportive therapy. Out of window to treat with Tamiflu now.  Ibuprofen  800 mg every 8 hours with food for body aches.  Benzonatate  100 mg BID as needed for cough. Fluids Rest Nutritious foods.  Work note provided.     ROS      Objective:    BP 118/76 (BP Location: Left Arm, Patient Position: Sitting, Cuff Size: Large)   Pulse (!) 118   Temp 98 F (36.7 C) (Oral)   Ht 5' 7 (1.702 m)   Wt 269 lb (122 kg)   LMP 08/28/2024 (Approximate)   SpO2 98%   BMI 42.13 kg/m    Physical Exam Vitals and nursing note reviewed.  Constitutional:      General: She is not in acute distress.    Appearance: Normal appearance.  Cardiovascular:     Rate and Rhythm: Normal rate.     Heart sounds: Normal heart sounds.  Pulmonary:     Effort: Pulmonary effort is normal.     Breath sounds: Normal breath sounds.  Skin:    General: Skin is warm and dry.  Neurological:     General: No focal deficit present.     Mental Status: She is alert. Mental status is at baseline.  Psychiatric:        Mood and Affect: Mood normal.        Behavior: Behavior normal.        Thought Content: Thought content normal.        Judgment: Judgment normal.     No results found for any visits on 09/11/24.      Assessment & Plan:   Problem List Items Addressed This Visit     Influenza A H1N1 infection - Primary   Patient is in today for follow-up from flu diagnosis on 1/3. Decided to not get Tamiflu at that  time. Continues to have body aches. Tylenol  helping some. Resting and drinking fluids. Supportive therapy. Out of window to treat with Tamiflu. Ibuprofen  800 mg every 8 hours with food for body aches.  Benzonatate  100 mg BID as needed for cough. Fluids Rest Nutritious foods.  Work note provided.  Follow-up as needed.        Relevant Medications   benzonatate  (TESSALON ) 100 MG capsule   Body aches   Relevant Medications   ibuprofen  (ADVIL ) 800 MG tablet    Meds ordered this encounter  Medications   ibuprofen  (ADVIL ) 800 MG tablet    Sig: Take 1 tablet (800 mg total) by mouth every 8 (eight) hours as needed. Take with food.    Dispense:  30 tablet    Refill:  0    Supervising Provider:   METHENEY, CATHERINE D [[id number]]   benzonatate  (TESSALON ) 100 MG capsule    Sig: Take 1 capsule (100 mg total) by mouth 2 (two) times daily as needed.    Dispense:  30 capsule    Refill:  0  Supervising Provider:   METHENEY, CATHERINE D [[id number]]  Agrees with plan of care discussed.  Questions answered.   Return if symptoms worsen or fail to improve.  Rhonda JONELLE Brownie, FNP   "

## 2024-09-11 NOTE — Assessment & Plan Note (Signed)
 Patient is in today for follow-up from flu diagnosis on 1/3. Decided to not get Tamiflu at that time. Continues to have body aches. Tylenol  helping some. Resting and drinking fluids. Supportive therapy. Out of window to treat with Tamiflu. Ibuprofen  800 mg every 8 hours with food for body aches.  Benzonatate  100 mg BID as needed for cough. Fluids Rest Nutritious foods.  Work note provided.  Follow-up as needed.

## 2024-09-11 NOTE — Patient Instructions (Signed)
Viral illnesses can take up to 10 days to resolve.   There is no role for an antibiotic.  Symptomatic treatment is ideal.   Take over the counter pain medication as needed. Acetaminophen and ibuprofen for fever and body aches. Mucinex and Robitussin for cough, if you have high blood pressure, take Coricidin for cough. Honey is also effective for cough, avoid if diabetic. Flonase or saline nasal spray for nasal congestion.    Read and follow instructions on the label and make sure not to combine other medications that may have same ingredients in it. It is important to not take too much.    Drink plenty of caffeine-free fluids. (If you have heart or kidney problems, follow the instructions of your specialist regarding amounts). Adequate fluids will help you to avoid dehydration.   If you are hungry, eat a bland diet, such as the BRAT diet (bananas, rice, applesauce, toast). Diet as tolerated if appetite is normal.   Get lots of rest.  Let us know if you are not improving or getting worse.

## 2024-10-03 ENCOUNTER — Telehealth: Payer: Self-pay

## 2024-10-03 DIAGNOSIS — E66813 Obesity, class 3: Secondary | ICD-10-CM

## 2024-10-03 MED ORDER — PHENTERMINE HCL 37.5 MG PO TABS: 37.5000 mg | ORAL_TABLET | Freq: Every day | ORAL | 0 refills | Status: AC

## 2024-10-03 MED ORDER — PHENTERMINE HCL 37.5 MG PO TABS: 37.5000 mg | ORAL_TABLET | Freq: Every day | ORAL | 0 refills | Status: DC

## 2024-10-03 NOTE — Telephone Encounter (Addendum)
 Copied from CRM [id number]. Topic: Clinical - Medication Refill >> Oct 03, 2024  3:46 PM Fonda T wrote: Medication: phentermine  (ADIPEX-P ) 37.5 MG tablet   Has the patient contacted their pharmacy? Yes, advised to contact office    This is the patient's preferred pharmacy:  [facility] [address], [place] - [address] [place] [address] Phone: [phone] Fax: [phone]   Is this the correct pharmacy for this prescription? Yes If no, delete pharmacy and type the correct one.   Has the prescription been filled recently? Yes  Is the patient out of the medication? Yes  Has the patient been seen for an appointment in the last year OR does the patient have an upcoming appointment? Yes  Can we respond through MyChart? Yes  Agent: Please be advised that Rx refills may take up to 3 business days. We ask that you follow-up with your pharmacy.  ERRONEOUS ENCOUNTER FOR MEDICATION REFILL 10/03/2024. - KLO  FORWARDING TO PROVIDER FOR FURTHER ADVISEMENT.

## 2024-10-03 NOTE — Telephone Encounter (Signed)
 Copied from CRM #[id number]. Topic: Appointments - Transfer of Care >> Oct 03, 2024  3:51 PM Fonda T wrote: Pt is requesting to transfer FROM: Darice Brownie, NP Pt is requesting to transfer TO: Dr. Colette Reason for requested transfer: provider no longer at office It is the responsibility of the team the patient would like to transfer to (Dr. Colette) to reach out to the patient if for any reason this transfer is not acceptable.  PT SCHEDULED 11/14/2024

## 2024-10-03 NOTE — Addendum Note (Signed)
 Addended by: RUCKER, Berman Grainger Y on: 10/03/2024 05:47 PM   Modules accepted: Orders

## 2024-10-03 NOTE — Telephone Encounter (Signed)
 Maximum time on treatment shouldn't exceed 12 weeks due to potential heart effects. She's filled for 2 months already. I've gone ahead and filled for the 3rd and final time.

## 2024-10-04 NOTE — Telephone Encounter (Signed)
 Message has been relayed to patient via MyChart. Task complete.

## 2024-10-11 ENCOUNTER — Ambulatory Visit: Admitting: Family Medicine

## 2024-11-14 ENCOUNTER — Encounter: Admitting: Family Medicine
